# Patient Record
Sex: Male | Born: 1965
Health system: Southern US, Community
[De-identification: ages and names within clinical notes are randomized; demographics above are authoritative.]

## PROBLEM LIST (undated history)

## (undated) HISTORY — PX: ABDOMINAL SURGERY: SHX537

---

## 1988-09-03 HISTORY — PX: ABDOMINAL SURGERY: SHX537

## 2000-05-19 ENCOUNTER — Emergency Department (HOSPITAL_COMMUNITY): Admission: EM | Admit: 2000-05-19 | Discharge: 2000-05-19 | Payer: Self-pay | Admitting: Emergency Medicine

## 2009-02-01 ENCOUNTER — Ambulatory Visit: Payer: Self-pay | Admitting: Internal Medicine

## 2009-02-01 DIAGNOSIS — F17200 Nicotine dependence, unspecified, uncomplicated: Secondary | ICD-10-CM

## 2010-08-06 ENCOUNTER — Emergency Department (HOSPITAL_COMMUNITY)
Admission: EM | Admit: 2010-08-06 | Discharge: 2010-08-06 | Payer: Self-pay | Source: Home / Self Care | Admitting: Emergency Medicine

## 2010-10-01 LAB — CONVERTED CEMR LAB
ALT: 11 units/L (ref 0–53)
AST: 17 units/L (ref 0–37)
Albumin: 4.2 g/dL (ref 3.5–5.2)
Alkaline Phosphatase: 92 units/L (ref 39–117)
Basophils Relative: 0.1 % (ref 0.0–3.0)
Bilirubin Urine: NEGATIVE
Eosinophils Relative: 2.7 % (ref 0.0–5.0)
GFR calc non Af Amer: 77.2 mL/min (ref >60)
Glucose, Bld: 81 mg/dL (ref 70–99)
HCT: 44.2 % (ref 39.0–52.0)
HDL: 50.7 mg/dL (ref >39.00)
Hemoglobin: 15.4 g/dL (ref 13.0–17.0)
Lymphs Abs: 1.9 10*3/uL (ref 0.7–4.0)
Monocytes Relative: 6.1 % (ref 3.0–12.0)
Neutro Abs: 3.9 10*3/uL (ref 1.4–7.7)
PSA: 0.52 ng/mL (ref 0.10–4.00)
Potassium: 4.7 meq/L (ref 3.5–5.1)
RBC: 4.87 M/uL (ref 4.22–5.81)
RDW: 12.8 % (ref 11.5–14.6)
Sodium: 144 meq/L (ref 135–145)
Total CHOL/HDL Ratio: 4
Total Protein, Urine: NEGATIVE mg/dL
Total Protein: 7.4 g/dL (ref 6.0–8.3)
Urine Glucose: NEGATIVE mg/dL
VLDL: 21.8 mg/dL (ref 0.0–40.0)
WBC: 6.4 10*3/uL (ref 4.5–10.5)
pH: 5.5 (ref 5.0–8.0)

## 2016-03-05 ENCOUNTER — Encounter (HOSPITAL_COMMUNITY): Payer: Self-pay

## 2016-03-05 ENCOUNTER — Emergency Department (HOSPITAL_COMMUNITY)
Admission: EM | Admit: 2016-03-05 | Discharge: 2016-03-05 | Disposition: A | Discharge status: Home / Self Care | Payer: Commercial Managed Care - HMO | Attending: Emergency Medicine | Admitting: Emergency Medicine

## 2016-03-05 DIAGNOSIS — M545 Low back pain, unspecified: Secondary | ICD-10-CM

## 2016-03-05 LAB — URINALYSIS, DIPSTICK ONLY
Bilirubin Urine: NEGATIVE
Glucose, UA: NEGATIVE mg/dL
Hgb urine dipstick: NEGATIVE
KETONES UR: NEGATIVE mg/dL
LEUKOCYTES UA: NEGATIVE
NITRITE: NEGATIVE
PROTEIN: NEGATIVE mg/dL
Specific Gravity, Urine: 1.018 (ref 1.005–1.030)
pH: 7 (ref 5.0–8.0)

## 2016-03-05 MED ORDER — CYCLOBENZAPRINE HCL 5 MG PO TABS: 5.0000 mg | ORAL_TABLET | Freq: Three times a day (TID) | ORAL | Status: DC | PRN

## 2016-03-05 MED ORDER — IBUPROFEN 200 MG PO TABS
600.0000 mg | ORAL_TABLET | Freq: Once | ORAL | Status: AC
  Administered 2016-03-05: 600 mg via ORAL
  Filled 2016-03-05: qty 1

## 2016-03-05 NOTE — ED Notes (Signed)
Pt anxiously requesting a sandwich . Pt instructed  He will be provided one at time of DC.

## 2016-03-05 NOTE — ED Notes (Signed)
Patient complains of lower back pain x 4 days, denies trauma. Pain with change in position

## 2016-03-05 NOTE — ED Notes (Signed)
Declined W/C at D/C and was escorted to lobby by RN. 

## 2016-03-05 NOTE — Discharge Instructions (Signed)
Read the information below.   Your urinalysis was normal. Take ibuprofen or Tylenol every 6 hours for pain relief. You have been prescribed a muscle relaxer that you can take as needed for relief. Muscle relaxants can make you drowsy, do not drive or operate heavy machinery after taking. You can apply heat or ice as needed for pain relief. Activity as tolerated. Use the prescribed medication as directed.  Please discuss all new medications with your pharmacist.   It is important he follow up with her primary care provider. Call to schedule an appointment within one week. You may return to the Emergency Department at any time for worsening condition or any new symptoms that concern you. Return to the emergency department if her symptoms worsen, develop a fever, have loss of bowel or bladder function, have numbness or weakness in her lower extremities, or unable to walk.   Back Pain, Adult Back pain is very common. The pain often gets better over time. The cause of back pain is usually not dangerous. Most people can learn to manage their back pain on their own.  HOME CARE  Watch your back pain for any changes. The following actions may help to lessen any pain you are feeling: 1. Stay active. Start with short walks on flat ground if you can. Try to walk farther each day. 2. Exercise regularly as told by your doctor. Exercise helps your back heal faster. It also helps avoid future injury by keeping your muscles strong and flexible. 3. Do not sit, drive, or stand in one place for more than 30 minutes. 4. Do not stay in bed. Resting more than 1-2 days can slow down your recovery. 5. Be careful when you bend or lift an object. Use good form when lifting: 1. Bend at your knees. 2. Keep the object close to your body. 3. Do not twist. 6. Sleep on a firm mattress. Lie on your side, and bend your knees. If you lie on your back, put a pillow under your knees. 7. Take medicines only as told by your  doctor. 8. Put ice on the injured area. 1. Put ice in a plastic bag. 2. Place a towel between your skin and the bag. 3. Leave the ice on for 20 minutes, 2-3 times a day for the first 2-3 days. After that, you can switch between ice and heat packs. 9. Avoid feeling anxious or stressed. Find good ways to deal with stress, such as exercise. 10. Maintain a healthy weight. Extra weight puts stress on your back. GET HELP IF:  1. You have pain that does not go away with rest or medicine. 2. You have worsening pain that goes down into your legs or buttocks. 3. You have pain that does not get better in one week. 4. You have pain at night. 5. You lose weight. 6. You have a fever or chills. GET HELP RIGHT AWAY IF:  1. You cannot control when you poop (bowel movement) or pee (urinate). 2. Your arms or legs feel weak. 3. Your arms or legs lose feeling (numbness). 4. You feel sick to your stomach (nauseous) or throw up (vomit). 5. You have belly (abdominal) pain. 6. You feel like you may pass out (faint).   This information is not intended to replace advice given to you by your health care provider. Make sure you discuss any questions you have with your health care provider.   Document Released: 02/06/2008 Document Revised: 09/10/2014 Document Reviewed: 12/22/2013 Elsevier Interactive Patient  Education 2016 [place] Injury Prevention Back injuries can be very painful. They can also be difficult to heal. After having one back injury, you are more likely to injure your back again. It is important to learn how to avoid injuring or re-injuring your back. The following tips can help you to prevent a back injury. WHAT SHOULD I KNOW ABOUT PHYSICAL FITNESS? 11. Exercise for 30 minutes per day on most days of the week or as told by your doctor. Make sure to: 1. Do aerobic exercises, such as walking, jogging, biking, or swimming. 2. Do exercises that increase balance and strength, such as tai chi  and yoga. 3. Do stretching exercises. This helps with flexibility. 4. Try to develop strong belly (abdominal) muscles. Your belly muscles help to support your back. 12. Stay at a healthy weight. This helps to decrease your risk of a back injury. WHAT SHOULD I KNOW ABOUT MY DIET? 7. Talk with your doctor about your overall diet. Take supplements and vitamins only as told by your doctor. 8. Talk with your doctor about how much calcium and vitamin D you need each day. These nutrients help to prevent weakening of the bones (osteoporosis). 9. Include good sources of calcium in your diet, such as: 1. Dairy products. 2. Green leafy vegetables. 3. Products that have had calcium added to them (fortified). 10. Include good sources of vitamin D in your diet, such as: 1. Milk. 2. Foods that have had vitamin D added to them. WHAT SHOULD I KNOW ABOUT MY POSTURE? 7. Sit up straight and stand up straight. Avoid leaning forward when you sit or hunching over when you stand. 8. Choose chairs that have good low-back (lumbar) support. 9. If you work at a desk, sit close to it so you do not need to lean over. Keep your chin tucked in. Keep your neck drawn back. Keep your elbows bent so your arms look like the letter "L" (right angle). 10. Sit high and close to the steering wheel when you drive. Add a low-back support to your car seat, if needed. 11. Avoid sitting or standing in one position for very long. Take breaks to get up, stretch, and walk around at least one time every hour. Take breaks every hour if you are driving for long periods of time. 12. Sleep on your side with your knees slightly bent, or sleep on your back with a pillow under your knees. Do not lie on the front of your body to sleep. WHAT SHOULD I KNOW ABOUT LIFTING, TWISTING, AND REACHING Lifting and Heavy Lifting 1. Avoid heavy lifting, especially lifting over and over again. If you must do heavy lifting: 1. Stretch before lifting. 2. Work  slowly. 3. Rest between lifts. 4. Use a tool such as a cart or a dolly to move objects if one is available. 5. Make several small trips instead of carrying one heavy load. 6. Ask for help when you need it, especially when moving big objects. 2. Follow these steps when lifting: 1. Stand with your feet shoulder-width apart. 2. Get as close to the object as you can. Do not pick up a heavy object that is far from your body. 3. Use handles or lifting straps if they are available. 4. Bend at your knees. Squat down, but keep your heels off the floor. 5. Keep your shoulders back. Keep your chin tucked in. Keep your back straight. 6. Lift the object slowly while you tighten the muscles in your legs,  belly, and butt. Keep the object as close to the center of your body as possible. 3. Follow these steps when putting down a heavy load: 1. Stand with your feet shoulder-width apart. 2. Lower the object slowly while you tighten the muscles in your legs, belly, and butt. Keep the object as close to the center of your body as possible. 3. Keep your shoulders back. Keep your chin tucked in. Keep your back straight. 4. Bend at your knees. Squat down, but keep your heels off the floor. 5. Use handles or lifting straps if they are available. Twisting and Reaching 1. Avoid lifting heavy objects above your waist. 2. Do not twist at your waist while you are lifting or carrying a load. If you need to turn, move your feet. 3. Do not bend over without bending at your knees. 4. Avoid reaching over your head, across a table, or for an object on a high surface.  WHAT ARE SOME OTHER TIPS? 1. Avoid wet floors and icy ground. Keep sidewalks clear of ice to prevent falls.  2. Do not sleep on a mattress that is too soft or too hard.  3. Keep items that you use often within easy reach.  4. Put heavier objects on shelves at waist level, and put lighter objects on lower or higher shelves. 5. Find ways to lower your  stress, such as: 1. Exercise. 2. Massage. 3. Relaxation techniques. 6. Talk with your doctor if you feel anxious or depressed. These conditions can make back pain worse. 7. Wear flat heel shoes with cushioned soles. 8. Avoid making quick (sudden) movements. 9. Use both shoulder straps when carrying a backpack. 10. Do not use any tobacco products, including cigarettes, chewing tobacco, or electronic cigarettes. If you need help quitting, ask your doctor.   This information is not intended to replace advice given to you by your health care provider. Make sure you discuss any questions you have with your health care provider.   Document Released: 02/06/2008 Document Revised: 01/04/2015 Document Reviewed: 08/24/2014 Elsevier Interactive Patient Education 2016 [place].  Back Exercises If you have pain in your back, do these exercises 2-3 times each day or as told by your doctor. When the pain goes away, do the exercises once each day, but repeat the steps more times for each exercise (do more repetitions). If you do not have pain in your back, do these exercises once each day or as told by your doctor. EXERCISES Single Knee to Chest Do these steps 3-5 times in a row for each leg: 13. Lie on your back on a firm bed or the floor with your legs stretched out. 26. Bring one knee to your chest. 15. Hold your knee to your chest by grabbing your knee or thigh. 16. Pull on your knee until you feel a gentle stretch in your lower back. 17. Keep doing the stretch for 10-30 seconds. 18. Slowly let go of your leg and straighten it. Pelvic Tilt Do these steps 5-10 times in a row: 11. Lie on your back on a firm bed or the floor with your legs stretched out. 12. Bend your knees so they point up to the ceiling. Your feet should be flat on the floor. 13. Tighten your lower belly (abdomen) muscles to press your lower back against the floor. This will make your tailbone point up to the ceiling instead of  pointing down to your feet or the floor. 14. Stay in this position for 5-10 seconds while  you gently tighten your muscles and breathe evenly. Cat-Cow Do these steps until your lower back bends more easily: 13. Get on your hands and knees on a firm surface. Keep your hands under your shoulders, and keep your knees under your hips. You may put padding under your knees. 14. Let your head hang down, and make your tailbone point down to the floor so your lower back is round like the back of a cat. 15. Stay in this position for 5 seconds. 16. Slowly lift your head and make your tailbone point up to the ceiling so your back hangs low (sags) like the back of a cow. 17. Stay in this position for 5 seconds. Press-Ups Do these steps 5-10 times in a row: 4. Lie on your belly (face-down) on the floor. 5. Place your hands near your head, about shoulder-width apart. 6. While you keep your back relaxed and keep your hips on the floor, slowly straighten your arms to raise the top half of your body and lift your shoulders. Do not use your back muscles. To make yourself more comfortable, you may change where you place your hands. 7. Stay in this position for 5 seconds. 8. Slowly return to lying flat on the floor. Bridges Do these steps 10 times in a row: 5. Lie on your back on a firm surface. 6. Bend your knees so they point up to the ceiling. Your feet should be flat on the floor. 7. Tighten your butt muscles and lift your butt off of the floor until your waist is almost as high as your knees. If you do not feel the muscles working in your butt and the back of your thighs, slide your feet 1-2 inches farther away from your butt. 8. Stay in this position for 3-5 seconds. 9. Slowly lower your butt to the floor, and let your butt muscles relax. If this exercise is too easy, try doing it with your arms crossed over your chest. Belly Crunches Do these steps 5-10 times in a row: 11. Lie on your back on a firm bed  or the floor with your legs stretched out. 12. Bend your knees so they point up to the ceiling. Your feet should be flat on the floor. 68. Cross your arms over your chest. 14. Tip your chin a little bit toward your chest but do not bend your neck. 79. Tighten your belly muscles and slowly raise your chest just enough to lift your shoulder blades a tiny bit off of the floor. 16. Slowly lower your chest and your head to the floor. Back Lifts Do these steps 5-10 times in a row: 1. Lie on your belly (face-down) with your arms at your sides, and rest your forehead on the floor. 2. Tighten the muscles in your legs and your butt. 3. Slowly lift your chest off of the floor while you keep your hips on the floor. Keep the back of your head in line with the curve in your back. Look at the floor while you do this. 4. Stay in this position for 3-5 seconds. 5. Slowly lower your chest and your face to the floor. GET HELP IF:  Your back pain gets a lot worse when you do an exercise.  Your back pain does not lessen 2 hours after you exercise. If you have any of these problems, stop doing the exercises. Do not do them again unless your doctor says it is okay. GET HELP RIGHT AWAY IF:  You have sudden,  very bad back pain. If this happens, stop doing the exercises. Do not do them again unless your doctor says it is okay.   This information is not intended to replace advice given to you by your health care provider. Make sure you discuss any questions you have with your health care provider.   Document Released: 09/22/2010 Document Revised: 05/11/2015 Document Reviewed: 10/14/2014 Elsevier Interactive Patient Education [employer].

## 2016-03-05 NOTE — ED Provider Notes (Signed)
CSN: [id number]     Arrival date & time 03/05/16  1015 History  By signing my name below, I, Emmanuella Mensah, attest that this documentation has been prepared under the direction and in the presence of Arvilla MeresAshley Meyer, PA-C. Electronically Signed: Angelene GiovanniEmmanuella Mensah, ED Scribe. 03/05/2016. 12:30 PM.    Chief Complaint  Patient presents with  . Back Pain   The history is provided by the patient. No language interpreter was used.   HPI Comments: Austin Walsh is a 50 y.o. male who presents to the Emergency Department complaining of gradually worsening intermittent non-radiating sharp shooting right lower back pain onset 4 days ago. He reports that the pain is worse with movement, especially bending but better when he stays still. No alleviating factors noted. Pt has not tried any medications for these symptoms PTA. 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 He denies any recent falls, injuries, or trauma. He denies any hx of cancer or IV drug use. He also denies any fever, unexpected weight loss, dysuria, hematuria, scrotal pain/swelling, penile discharge, abdominal pain, n/v/d, rash, numbness/weakness to BLE, bowel/bladder incontinence, or saddle anesthesia. Pt received 600 mg Ibuprofen here in the ED and states that it provided mild relief.    History reviewed. No pertinent past medical history. History reviewed. No pertinent past surgical history. No family history on file. Social History  Substance Use Topics  . Smoking status: Never Smoker   . Smokeless tobacco: None  . Alcohol Use: None    Review of Systems  Constitutional: Negative for fever, chills, diaphoresis and unexpected weight change.  Gastrointestinal: Negative for nausea, vomiting, abdominal pain and diarrhea.  Genitourinary: Negative for dysuria, hematuria, penile swelling, scrotal swelling and testicular pain.  Musculoskeletal: Positive for back pain.  Skin: Negative for rash.   Neurological: Negative for weakness and numbness.      Allergies  Review of patient's allergies indicates no known allergies.  Home Medications   Prior to Admission medications   Medication Sig Start Date End Date Taking? Authorizing Provider  cyclobenzaprine (FLEXERIL) 5 MG tablet Take 1 tablet (5 mg total) by mouth 3 (three) times daily as needed for muscle spasms. 03/05/16   Lona KettleAshley Laurel Meyer, PA-C   BP 117/80 mmHg  Pulse 86  Temp(Src) 98 F (36.7 C) (Oral)  Resp 17  Ht 6\' 1"  (1.854 m)  Wt 92.987 kg  BMI 27.05 kg/m2  SpO2 97% Physical Exam  Constitutional: He appears well-developed and well-nourished. No distress.  HENT:  Head: Normocephalic and atraumatic.  Eyes: Conjunctivae are normal. No scleral icterus.  Neck: Normal range of motion.  Cardiovascular: Normal rate and intact distal pulses.   Pulmonary/Chest: Effort normal. No respiratory distress.  Abdominal: Soft. Bowel sounds are normal. There is no tenderness.  Musculoskeletal:  No tenderness to the spine, no step-off Tenderness to lumbar paravertebral muscles, no CVA tenderness.  Full ROM of back Negative straight leg raise  Neurological: He is alert. Coordination normal.  Mental Status:  Alert, thought content appropriate, able to give a coherent history. Speech fluent without evidence of aphasia. Able to follow 2 step commands without difficulty.  Cranial Nerves:  II:  Peripheral visual fields grossly normal, pupils equal, round, reactive to light III,IV, VI: ptosis not present, extra-ocular motions intact bilaterally  V,VII: smile symmetric, facial light touch sensation equal VIII: hearing grossly normal to voice  X: uvula elevates symmetrically  XI: bilateral shoulder shrug symmetric and strong XII: midline tongue extension  without fassiculations Motor:  Normal tone. 5/5 in upper and lower extremities bilaterally including strong and equal grip strength and dorsiflexion/plantar flexion Sensory: light  touch normal in all extremities.  Deep Tendon Reflexes: 2+ and symmetric in the biceps and patella Cerebellar: normal finger-to-nose with bilateral upper extremities Gait: normal gait and balance CV: distal pulses palpable throughout   Skin: Skin is warm and dry. He is not diaphoretic.  Psychiatric: He has a normal mood and affect.  Nursing note and vitals reviewed.   ED Course  Procedures (including critical care time) DIAGNOSTIC STUDIES: Oxygen Saturation is 97% on RA, normal by my interpretation.    COORDINATION OF CARE: 12:21 PM- Pt advised of plan for treatment and pt agrees. Pt will provide urine sample for UA for further evaluation. He will receive muscle relaxants. Advised to use Tylenol and Ibuprofen for pain relief. Return precautions discussed with numbness/weakness, fever, or bowel/bladder incontinence. Re-assured pt he can return to work in 2 days.   MDM   Final diagnoses:  Right-sided low back pain without sciatica    Pt is afebrile and nontoxic appearing in NAD. Vital signs are stable. Patient with back pain.  No neurological deficits and normal neuro exam.  Patient can walk but states is painful.  No loss of bowel or bladder control.  Low suspicion for cauda equina.  No fever, night sweats, weight loss, h/o cancer, IVDU - low suspicion for epidural abscess. U/A negative for UTI. Suspect MSK in nature. RICE protocol and pain medicine indicated and discussed with patient. Encouraged follow up with PCP. Return precautions discussed. Pt voiced understanding and is agreeable.    I personally performed the services described in this documentation, which was scribed in my presence. The recorded information has been reviewed and is accurate.   Herminio Commonsshley Laurel HammondMeyer, [place] JerseyPA-C 03/06/16 [id number]  Mancel BaleElliott Wentz, MD 03/08/16 [phone]

## 2017-01-30 ENCOUNTER — Ambulatory Visit (HOSPITAL_COMMUNITY)
Admission: EM | Admit: 2017-01-30 | Discharge: 2017-01-30 | Disposition: A | Discharge status: Home / Self Care | Payer: Self-pay | Attending: Emergency Medicine | Admitting: Emergency Medicine

## 2017-01-30 ENCOUNTER — Encounter (HOSPITAL_COMMUNITY): Payer: Self-pay | Admitting: Family Medicine

## 2017-01-30 ENCOUNTER — Ambulatory Visit (INDEPENDENT_AMBULATORY_CARE_PROVIDER_SITE_OTHER): Payer: Self-pay

## 2017-01-30 DIAGNOSIS — R071 Chest pain on breathing: Secondary | ICD-10-CM

## 2017-01-30 DIAGNOSIS — S29019A Strain of muscle and tendon of unspecified wall of thorax, initial encounter: Secondary | ICD-10-CM | POA: Diagnosis present

## 2017-01-30 DIAGNOSIS — M62830 Muscle spasm of back: Secondary | ICD-10-CM | POA: Diagnosis present

## 2017-01-30 DIAGNOSIS — Z720 Tobacco use: Secondary | ICD-10-CM

## 2017-01-30 DIAGNOSIS — S29012A Strain of muscle and tendon of back wall of thorax, initial encounter: Secondary | ICD-10-CM | POA: Insufficient documentation

## 2017-01-30 DIAGNOSIS — F17210 Nicotine dependence, cigarettes, uncomplicated: Secondary | ICD-10-CM

## 2017-01-30 DIAGNOSIS — X58XXXA Exposure to other specified factors, initial encounter: Secondary | ICD-10-CM | POA: Insufficient documentation

## 2017-01-30 DIAGNOSIS — F17200 Nicotine dependence, unspecified, uncomplicated: Secondary | ICD-10-CM | POA: Insufficient documentation

## 2017-01-30 MED ORDER — DIAZEPAM 5 MG/ML IJ SOLN
5.0000 mg | Freq: Once | INTRAMUSCULAR | Status: AC
  Administered 2017-01-30: 5 mg via INTRAMUSCULAR

## 2017-01-30 MED ORDER — DIAZEPAM 5 MG/ML IJ SOLN
INTRAMUSCULAR | Status: AC
  Filled 2017-01-30: qty 2

## 2017-01-30 MED ORDER — ORPHENADRINE CITRATE ER 100 MG PO TB12: 100.0000 mg | ORAL_TABLET | Freq: Two times a day (BID) | ORAL | 0 refills | Status: DC

## 2017-01-30 MED ORDER — KETOROLAC TROMETHAMINE 30 MG/ML IJ SOLN
INTRAMUSCULAR | Status: AC
  Filled 2017-01-30: qty 1

## 2017-01-30 MED ORDER — LIDOCAINE 5 % EX PTCH: 1.0000 | MEDICATED_PATCH | CUTANEOUS | 0 refills | Status: DC

## 2017-01-30 MED ORDER — KETOROLAC TROMETHAMINE 60 MG/2ML IM SOLN
30.0000 mg | Freq: Once | INTRAMUSCULAR | Status: AC
  Administered 2017-01-30: 30 mg via INTRAMUSCULAR

## 2017-01-30 NOTE — ED Provider Notes (Signed)
CSN: [id number]     Arrival date & time 01/30/17  1237 History   None    Chief Complaint  Patient presents with  . Back Pain   (Consider location/radiation/quality/duration/timing/severity/associated sxs/prior Treatment) The history is provided by the patient and a relative. No language interpreter was used.  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 No pertinent past medical history. History reviewed. No pertinent surgical history. History reviewed. No pertinent family history. Social History  Substance Use Topics  . Smoking status: Current Every Day Smoker  . Smokeless tobacco: Never Used  . Alcohol use Not on file    Review of Systems  Constitutional: Positive for activity change. Negative for fever.  HENT: Negative.   Eyes: Negative.   Respiratory: Negative for cough, chest tightness, shortness of breath and wheezing.        Right sided chest wall pain with palpation,deep breath  Cardiovascular: Negative for chest pain and palpitations.  Gastrointestinal: Negative.   Endocrine: Negative.   Genitourinary: Negative.   Musculoskeletal: Positive for back pain and myalgias.  Skin: Negative.   Allergic/Immunologic: Negative.   Neurological: Negative.   Hematological: Negative.   Psychiatric/Behavioral: Negative.   All other systems reviewed and are negative.   Allergies  Patient has no known allergies.  Home Medications   Prior to Admission medications   Medication Sig Start  Date End Date Taking? Authorizing Provider  cyclobenzaprine (FLEXERIL) 5 MG tablet Take 1 tablet (5 mg total) by mouth 3 (three) times daily as needed for muscle spasms. 03/05/16   Arvilla Meres L, PA-C  lidocaine (LIDODERM) 5 % Place 1 patch onto the skin daily. Remove & Discard patch within 12 hours or as directed by MD 01/30/17   Cherilyn Sautter, Para March, NP  orphenadrine (NORFLEX) 100 MG tablet Take 1 tablet (100 mg total) by mouth 2 (two) times daily. 01/30/17   Jalea Bronaugh, Para March, NP   Meds Ordered and Administered this Visit   Medications  ketorolac (TORADOL) injection 30 mg (30 mg Intramuscular Given 01/30/17 1341)  diazepam (VALIUM) injection 5 mg (5 mg Intramuscular Given 01/30/17 1343)    BP (!) 135/99   Pulse (!) 104   Temp 98.1 F (36.7 C)   Resp 18   SpO2 95%  No data found.   Physical Exam  Constitutional: He is oriented to person, place, and time. He appears well-developed and well-nourished. He is active and cooperative. He appears distressed.  HENT:  Head: Normocephalic.  Eyes: Pupils are equal, round, and reactive to light.  Neck: Normal range of motion.  Cardiovascular: Normal rate, regular rhythm and normal pulses.   Pulmonary/Chest: Effort normal and breath sounds normal.     He exhibits tenderness.  Musculoskeletal: Normal range of motion.  Neurological: He is alert and oriented to person, place, and time. GCS eye subscore is 4. GCS verbal subscore is 5. GCS motor subscore is 6.  Skin: Skin is warm and dry. No rash noted.  Psychiatric: His behavior is normal. His mood appears  anxious.  Nursing note and vitals reviewed.   Urgent Care Course     Procedures (including critical care time)  Labs Review Labs Reviewed - No data to display  Imaging Review Dg Ribs Unilateral W/chest Right  Result Date: 01/30/2017 CLINICAL DATA:  Chest pain and shortness of Breath EXAM: RIGHT RIBS AND CHEST - 3+ VIEW COMPARISON:  None. FINDINGS: Cardiac shadow is within normal  limits. Bibasilar opacities are noted likely related atelectasis. No acute displaced or deforming rib fractures are seen. No pneumothorax is noted. IMPRESSION: Bibasilar changes as described.  No acute rib fracture is noted. Electronically Signed   By: Alcide CleverMark  Lukens M.D.   On: 01/30/2017 13:40          MDM   1. Muscle spasm of back   2. Thoracic myofascial strain, initial encounter   3. Cigarette smoker     Valium and Toradol given in office, pt reassessed improved w meds. Rest, push fluids. Use Incentive Spirometer as directed. Go immediately to Er if you develop shortness of breath, chest pain,palpitations, or worsening pain. Avoid lifting, turning,twisting. Pt and family verbalized understanding to this provider.    Clancy Gourdefelice, Ruthe Roemer, NP 01/30/17 [phone]

## 2017-01-30 NOTE — ED Triage Notes (Signed)
Pt here for right sided back pain. Pt points to where ribs area and into lower back. sts started last night. Denies injury or heavy lifting. sts hurts to move and breathe and breathing is different. Denies cough and fever.

## 2017-01-30 NOTE — Discharge Instructions (Signed)
Rest, push fluids. Use Incentive Spirometer as directed. Go immediately to Er if you develop shortness of breath, chest pain,palpitations, or worsening pain. Avoid lifting, turning,twisting.

## 2017-01-30 NOTE — ED Notes (Signed)
Patient in xray at attempt to administer pain medication.

## 2017-01-31 ENCOUNTER — Encounter (HOSPITAL_COMMUNITY): Payer: Self-pay | Admitting: Emergency Medicine

## 2017-01-31 ENCOUNTER — Emergency Department (HOSPITAL_COMMUNITY)
Admission: EM | Admit: 2017-01-31 | Discharge: 2017-01-31 | Disposition: A | Discharge status: Home / Self Care | Payer: Commercial Managed Care - HMO | Attending: Physician Assistant | Admitting: Physician Assistant

## 2017-01-31 DIAGNOSIS — F17200 Nicotine dependence, unspecified, uncomplicated: Secondary | ICD-10-CM | POA: Insufficient documentation

## 2017-01-31 DIAGNOSIS — Z720 Tobacco use: Secondary | ICD-10-CM

## 2017-01-31 DIAGNOSIS — M62830 Muscle spasm of back: Secondary | ICD-10-CM | POA: Insufficient documentation

## 2017-01-31 LAB — CBC WITH DIFFERENTIAL/PLATELET
BASOS ABS: 0 10*3/uL (ref 0.0–0.1)
Basophils Relative: 0 %
EOS PCT: 3 %
Eosinophils Absolute: 0.3 10*3/uL (ref 0.0–0.7)
HCT: 41.9 % (ref 39.0–52.0)
Hemoglobin: 14.8 g/dL (ref 13.0–17.0)
LYMPHS ABS: 2.4 10*3/uL (ref 0.7–4.0)
LYMPHS PCT: 24 %
MCH: 31.9 pg (ref 26.0–34.0)
MCHC: 35.3 g/dL (ref 30.0–36.0)
MCV: 90.3 fL (ref 78.0–100.0)
MONO ABS: 1.3 10*3/uL — AB (ref 0.1–1.0)
Monocytes Relative: 13 %
Neutro Abs: 6 10*3/uL (ref 1.7–7.7)
Neutrophils Relative %: 60 %
PLATELETS: 185 10*3/uL (ref 150–400)
RBC: 4.64 MIL/uL (ref 4.22–5.81)
RDW: 13.5 % (ref 11.5–15.5)
WBC: 9.9 10*3/uL (ref 4.0–10.5)

## 2017-01-31 LAB — BASIC METABOLIC PANEL
ANION GAP: 8 (ref 5–15)
BUN: 12 mg/dL (ref 6–20)
CALCIUM: 8.3 mg/dL — AB (ref 8.9–10.3)
CO2: 27 mmol/L (ref 22–32)
Chloride: 103 mmol/L (ref 101–111)
Creatinine, Ser: 1.16 mg/dL (ref 0.61–1.24)
GFR calc Af Amer: >60 mL/min (ref >60)
GLUCOSE: 109 mg/dL — AB (ref 65–99)
POTASSIUM: 3.8 mmol/L (ref 3.5–5.1)
Sodium: 138 mmol/L (ref 135–145)

## 2017-01-31 LAB — URINALYSIS, ROUTINE W REFLEX MICROSCOPIC
Bilirubin Urine: NEGATIVE
GLUCOSE, UA: NEGATIVE mg/dL
HGB URINE DIPSTICK: NEGATIVE
Ketones, ur: NEGATIVE mg/dL
LEUKOCYTES UA: NEGATIVE
Nitrite: NEGATIVE
PH: 5 (ref 5.0–8.0)
PROTEIN: NEGATIVE mg/dL
Specific Gravity, Urine: 1.031 — ABNORMAL HIGH (ref 1.005–1.030)

## 2017-01-31 MED ORDER — PREDNISONE 50 MG PO TABS: 50.0000 mg | ORAL_TABLET | Freq: Every day | ORAL | 0 refills | Status: DC

## 2017-01-31 MED ORDER — TRAMADOL HCL 50 MG PO TABS: 50.0000 mg | ORAL_TABLET | Freq: Four times a day (QID) | ORAL | 0 refills | Status: DC | PRN

## 2017-01-31 MED ORDER — KETOROLAC TROMETHAMINE 15 MG/ML IJ SOLN: 15.0000 mg | Freq: Once | INTRAMUSCULAR | Status: DC

## 2017-01-31 MED ORDER — OXYCODONE-ACETAMINOPHEN 5-325 MG PO TABS
ORAL_TABLET | ORAL | Status: AC
  Filled 2017-01-31: qty 1

## 2017-01-31 MED ORDER — OXYCODONE-ACETAMINOPHEN 5-325 MG PO TABS
1.0000 | ORAL_TABLET | Freq: Once | ORAL | Status: AC
  Administered 2017-01-31: 1 via ORAL
  Filled 2017-01-31: qty 1

## 2017-01-31 MED ORDER — OXYCODONE-ACETAMINOPHEN 5-325 MG PO TABS
1.0000 | ORAL_TABLET | Freq: Once | ORAL | Status: AC
  Administered 2017-01-31: 1 via ORAL

## 2017-01-31 NOTE — ED Triage Notes (Signed)
Patient reports right lower back pain onset last night worse with movement /changing positions , denies dysuria or hematuria , seen at an urgent care yesterday prescribed with medication with non relief.

## 2017-01-31 NOTE — Discharge Instructions (Signed)
Take medications as written. If he develop inability to fall, fever, loss of bowel or bladder function returned to the emergency department. He can return at any point. Please follow up with her primary care provider in the next 2 days. I provided information on [facility] therapy in addition to the medications. Please take a muscle relaxer that you were prescribed at the urgent care at night, as written.

## 2017-01-31 NOTE — ED Provider Notes (Signed)
[facility]-EMERGENCY DEPT Provider Note   CSN: [id number] Arrival date & time: 01/31/17  0340     History   Chief Complaint Chief Complaint  Patient presents with  . Back Pain    HPI Austin Walsh is a 51 y.o. male who presents today with right mid and lower back pain 1 day. The patient was seen. The patient was seen yesterday at urgent care for same complaint. He received a x-ray of ribs with chest which did not show any acute rib fracture. Did show bibasilar changes. He was given Valium and Toradol office which improved his symptoms. Was given muscle relaxer at discharge. He presents today with ongoing, 10/10, constant, sharp, mid to lower right sided back pain that radiates into his right side. The patient notes that this is worsened with movement. He has not taken anything for this today. He does not feel that muscle relaxer helped improve his pain yesterday. 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 The patient denies trauma, history of cancer, fever, injection drug use, numbness, tingling, walking, weakness, saddle anesthesia, urinary retention, unintentional weight loss. He still didn't control his bowel and bladder functions.   HPI  History reviewed. No pertinent past medical history.  Patient Active Problem List   Diagnosis Date Noted  . Muscle spasm of back 01/30/2017  . Thoracic myofascial strain 01/30/2017  . Cigarette smoker 01/30/2017  . TOBACCO USE DISORDER/SMOKER-SMOKING CESSATION DISCUSSED 02/01/2009    Past Surgical History:  Procedure Laterality Date  . ABDOMINAL SURGERY         Home Medications    Prior to Admission medications   Medication Sig Start Date End Date Taking? Authorizing Provider  cyclobenzaprine (FLEXERIL) 5 MG tablet Take 1 tablet (5 mg total) by mouth 3 (three) times daily as needed for muscle spasms. 03/05/16   Arvilla MeresMeyer, Ashley L, PA-C  lidocaine (LIDODERM) 5 % Place 1 patch onto  the skin daily. Remove & Discard patch within 12 hours or as directed by MD 01/30/17   Defelice, Para MarchJeanette, NP  orphenadrine (NORFLEX) 100 MG tablet Take 1 tablet (100 mg total) by mouth 2 (two) times daily. 01/30/17   Defelice, Para MarchJeanette, NP  predniSONE (DELTASONE) 50 MG tablet Take 1 tablet (50 mg total) by mouth daily with breakfast. 01/31/17   Argie Applegate, Elmer SowMichael M, PA-C  traMADol (ULTRAM) 50 MG tablet Take 1 tablet (50 mg total) by mouth every 6 (six) hours as needed. 01/31/17   Nesreen Albano, Elmer SowMichael M, PA-C    Family History No family history on file.  Social History Social History  Substance Use Topics  . Smoking status: Current Every Day Smoker  . Smokeless tobacco: Never Used  . Alcohol use No     Allergies   Patient has no known allergies.   Review of Systems Review of Systems  All other systems reviewed and are negative.    Physical Exam Updated Vital Signs BP 129/71   Pulse (!) 110   Temp 98.5 F (36.9 C) (Oral)   Resp (!) 22   Ht 6\' 2"  (1.88 m)   Wt 91.6 kg (202 lb)   SpO2 98%   BMI 25.94 kg/m   Physical Exam  Constitutional: He appears well-developed and well-nourished.  HENT:  Head: Normocephalic and atraumatic.  Eyes: Conjunctivae are normal. Right eye exhibits no discharge. Left eye exhibits no discharge. No scleral icterus.  Cardiovascular: Normal rate, regular rhythm and normal heart  sounds.   Pulmonary/Chest: Effort normal. No respiratory distress.  Musculoskeletal:       Right hip: Normal.       Left hip: Normal.       Right knee: Normal.       Left knee: Normal.       Cervical back: Normal.       Thoracic back: He exhibits tenderness (right side), pain and spasm.       Lumbar back: He exhibits decreased range of motion (due to pain), tenderness (right), pain and spasm. He exhibits no bony tenderness and no swelling.  Neurological: He is alert. He has normal strength and normal reflexes. No sensory deficit. Gait (able) normal.  Reflex Scores:       Patellar reflexes are 2+ on the right side and 2+ on the left side.      Achilles reflexes are 2+ on the right side and 2+ on the left side. Skin: Skin is warm and dry. No pallor.  Psychiatric: He has a normal mood and affect.  Nursing note and vitals reviewed.    ED Treatments / Results  Labs (all labs ordered are listed, but only abnormal results are displayed) Labs Reviewed  CBC WITH DIFFERENTIAL/PLATELET - Abnormal; Notable for the following:       Result Value   Monocytes Absolute 1.3 (*)    All other components within normal limits  BASIC METABOLIC PANEL - Abnormal; Notable for the following:    Glucose, Bld 109 (*)    Calcium 8.3 (*)    All other components within normal limits  URINALYSIS, ROUTINE W REFLEX MICROSCOPIC - Abnormal; Notable for the following:    Specific Gravity, Urine 1.031 (*)    All other components within normal limits    EKG  EKG Interpretation None       Radiology Dg Ribs Unilateral W/chest Right  Result Date: 01/30/2017 CLINICAL DATA:  Chest pain and shortness of Breath EXAM: RIGHT RIBS AND CHEST - 3+ VIEW COMPARISON:  None. FINDINGS: Cardiac shadow is within normal limits. Bibasilar opacities are noted likely related atelectasis. No acute displaced or deforming rib fractures are seen. No pneumothorax is noted. IMPRESSION: Bibasilar changes as described.  No acute rib fracture is noted. Electronically Signed   By: Alcide Clever M.D.   On: 01/30/2017 13:40    Procedures Procedures (including critical care time)  Medications Ordered in ED Medications  oxyCODONE-acetaminophen (PERCOCET/ROXICET) 5-325 MG per tablet 1 tablet (1 tablet Oral Given 01/31/17 0400)  oxyCODONE-acetaminophen (PERCOCET/ROXICET) 5-325 MG per tablet 1 tablet (1 tablet Oral Given 01/31/17 0951)     Initial Impression / Assessment and Plan / ED Course  I have reviewed the triage vital signs and the nursing notes.  Pertinent labs & imaging results that were available during  my care of the patient were reviewed by me and considered in my medical decision making (see chart for details).     Austin Walsh is a 51 y.o. male who presents today with right mid and lower back pain 1 day. The patient denies trauma, history of cancer, fever, injection drug use, numbness, tingling, walking, weakness, saddle anesthesia, urinary retention, unintentional weight loss. He still didn't control his bowel and bladder functions. Patient is tender to touch along lumbar and thoracic on right side. Cardiopulmonary exam benign. Patients vitals wnl at time of exam. Triage note reviewed. Urgent care note from yesterday reviewed. Xrays from yesterday visit reviewed. Labs unremarkable.   The patient was given percocet for  pain relief in house. Sent home with Ultram, Prednisone. Told to take muscle relaxer's at night. Follow up with PCP in the next 2 days to evaluate for further treatment. Return precautions given. Patient told he can return at anytime. Documented vital signs elevated at time of discharge as patient is up pacing in room.   Patient does not have any history of medications on the [place] substance abuse registry.    Final Clinical Impressions(s) / ED Diagnoses   Final diagnoses:  Muscle spasm of back  Tobacco abuse    New Prescriptions New Prescriptions   PREDNISONE (DELTASONE) 50 MG TABLET    Take 1 tablet (50 mg total) by mouth daily with breakfast.   TRAMADOL (ULTRAM) 50 MG TABLET    Take 1 tablet (50 mg total) by mouth every 6 (six) hours as needed.     Jacinto Halim, PA-C 01/31/17 1003    Jacinto Halim, PA-C 01/31/17 1028    Mackuen, Cindee Salt, MD 01/31/17 [phone]

## 2020-07-19 ENCOUNTER — Encounter: Payer: Self-pay | Admitting: Critical Care Medicine

## 2020-07-19 ENCOUNTER — Ambulatory Visit: Payer: Self-pay | Attending: Critical Care Medicine | Admitting: Critical Care Medicine

## 2020-07-19 ENCOUNTER — Other Ambulatory Visit: Payer: Self-pay | Admitting: Critical Care Medicine

## 2020-07-19 ENCOUNTER — Other Ambulatory Visit: Payer: Self-pay

## 2020-07-19 VITALS — BP 104/70 | HR 83 | Ht 73.74 in | Wt 240.8 lb

## 2020-07-19 DIAGNOSIS — Z0000 Encounter for general adult medical examination without abnormal findings: Secondary | ICD-10-CM

## 2020-07-19 DIAGNOSIS — Z1211 Encounter for screening for malignant neoplasm of colon: Secondary | ICD-10-CM

## 2020-07-19 DIAGNOSIS — F17200 Nicotine dependence, unspecified, uncomplicated: Secondary | ICD-10-CM

## 2020-07-19 DIAGNOSIS — Z1159 Encounter for screening for other viral diseases: Secondary | ICD-10-CM

## 2020-07-19 DIAGNOSIS — N529 Male erectile dysfunction, unspecified: Secondary | ICD-10-CM

## 2020-07-19 DIAGNOSIS — Z114 Encounter for screening for human immunodeficiency virus [HIV]: Secondary | ICD-10-CM

## 2020-07-19 DIAGNOSIS — Z125 Encounter for screening for malignant neoplasm of prostate: Secondary | ICD-10-CM

## 2020-07-19 MED ORDER — NICOTINE POLACRILEX 4 MG MT LOZG: 4.0000 mg | LOZENGE | OROMUCOSAL | 0 refills | Status: DC | PRN

## 2020-07-19 MED ORDER — SILDENAFIL CITRATE 100 MG PO TABS: 50.0000 mg | ORAL_TABLET | Freq: Every day | ORAL | 11 refills | Status: DC | PRN

## 2020-07-19 MED FILL — SILDENAFIL CITRATE 100 MG T: 100 | 30 days supply | Qty: 5 | Fill #0
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
## 2020-07-19 NOTE — Assessment & Plan Note (Signed)
Erectile dysfunction unclear etiology doubt organic  Will do prostate monitor screen and PSA  Also check testosterone level  Begin Viagra as needed  May yet need urology referral

## 2020-07-19 NOTE — Progress Notes (Signed)
Establish care -no other issues

## 2020-07-19 NOTE — Assessment & Plan Note (Signed)
° ° °•   Current smoking consumption amount: 1/2 pack a day  . Dicsussion on advise to quit smoking and smoking impacts: Cardiovascular and lung health  . Patient's willingness to quit: Interested in quitting  . Methods to quit smoking discussed: Nicotine replacement and behavioral modification  . Medication management of smoking session drugs discussed: Nicotine lozenges 4 mg to take 3 times daily as needed  . Resources provided:  AVS   . Setting quit date not yet established  . Follow-up arranged 2 months   Time spent counseling the patient: 5 minutes   

## 2020-07-19 NOTE — Assessment & Plan Note (Addendum)
Wellness exam complete he has mild periodontal disease and is brushing his teeth daily  Patient has erectile dysfunction this will be addressed see below  Patient is an active smoker this will be addressed see below  Full and complete screening labs obtained including HIV hepatitis C metabolic panel profile complete blood count PSA testosterone levels  Colon cancer screen with Fecal occult  Patient also will receive a Tdap  Patient to obtain Covid booster shot

## 2020-07-19 NOTE — Patient Instructions (Addendum)
Use nicotine lozenges 2-3 times daily to reduce and and tobacco use, see instructions below  Use Viagra as needed for erectile dysfunction  A tetanus vaccine was given  Labs today include hepatitis C HIV metabolic panel complete blood count prostate cancer screening,  a stool kit for fecal occult to rule out colon cancer was issued please process and return, and testosterone levels will be checked  Focus on a healthy diet as you are body mass index is elevated at 31, see below for food choices  Increase your activity levels when not working by walking 30 minutes a day for 3 to 4 days weekly  Please obtain a Covid vaccine booster see information below where you can find a booster can be the [employer] or [employer] or [employer] Information can be found at: ShippingScam.co.uk For questions related to vaccine distribution or appointments, please email [email or web address] or call [phone].      Obesity, Adult Obesity is having too much body fat. Being obese means that your weight is more than what is healthy for you. BMI is a number that explains how much body fat you have. If you have a BMI of 30 or more, you are obese. Obesity is often caused by eating or drinking more calories than your body uses. Changing your lifestyle can help you lose weight. Obesity can cause serious health problems, such as:  Stroke.  Coronary artery disease (CAD).  Type 2 diabetes.  Some types of cancer, including cancers of the colon, breast, uterus, and gallbladder.  Osteoarthritis.  High blood pressure (hypertension).  High cholesterol.  Sleep apnea.  Gallbladder stones.  Infertility problems. What are the causes?  Eating meals each day that are high in calories, sugar, and fat.  Being born with genes that may make you more likely to become obese.  Having a medical condition that causes obesity.  Taking  certain medicines.  Sitting a lot (having a sedentary lifestyle).  Not getting enough sleep.  Drinking a lot of drinks that have sugar in them. What increases the risk?  Having a family history of obesity.  Being an [place] American woman.  Being a Hispanic man.  Living in an area with limited access to: ? Romilda Garret, recreation centers, or sidewalks. ? Healthy food choices, such as grocery stores and farmers' markets. What are the signs or symptoms? The main sign is having too much body fat. How is this treated?  Treatment for this condition often includes changing your lifestyle. Treatment may include: ? Changing your diet. This may include making a healthy meal plan. ? Exercise. This may include activity that causes your heart to beat faster (aerobic exercise) and strength training. Work with your doctor to design a program that works for you. ? Medicine to help you lose weight. This may be used if you are not able to lose 1 pound a week after 6 weeks of healthy eating and more exercise. ? Treating conditions that cause the obesity. ? Surgery. Options may include gastric banding and gastric bypass. This may be done if:  Other treatments have not helped to improve your condition.  You have a BMI of 40 or higher.  You have life-threatening health problems related to obesity. Follow these instructions at home: Eating and drinking   Follow advice from your doctor about what to eat and drink. Your doctor may tell you to: ? Limit fast food, sweets, and processed snack foods. ? Choose low-fat options. For example, choose low-fat milk  instead of whole milk. ? Eat 5 or more servings of fruits or vegetables each day. ? Eat at home more often. This gives you more control over what you eat. ? Choose healthy foods when you eat out. ? Learn to read food labels. This will help you learn how much food is in 1 serving. ? Keep low-fat snacks available. ? Avoid drinks that have a lot of  sugar in them. These include soda, fruit juice, iced tea with sugar, and flavored milk.  Drink enough water to keep your pee (urine) pale yellow.  Do not go on fad diets. Physical activity  Exercise often, as told by your doctor. Most adults should get up to 150 minutes of moderate-intensity exercise every week.Ask your doctor: ? What types of exercise are safe for you. ? How often you should exercise.  Warm up and stretch before being active.  Do slow stretching after being active (cool down).  Rest between times of being active. Lifestyle  Work with your doctor and a food expert (dietitian) to set a weight-loss goal that is best for you.  Limit your screen time.  Find ways to reward yourself that do not involve food.  Do not drink alcohol if: ? Your doctor tells you not to drink. ? You are pregnant, may be pregnant, or are planning to become pregnant.  If you drink alcohol: ? Limit how much you use to:  0-1 drink a day for women.  0-2 drinks a day for men. ? Be aware of how much alcohol is in your drink. In the [place] one drink equals one 12 oz bottle of beer (355 mL), one 5 oz glass of wine (148 mL), or one 1 oz glass of hard liquor (44 mL). General instructions  Keep a weight-loss journal. This can help you keep track of: ? The food that you eat. ? How much exercise you get.  Take over-the-counter and prescription medicines only as told by your doctor.  Take vitamins and supplements only as told by your doctor.  Think about joining a support group.  Keep all follow-up visits as told by your doctor. This is important. Contact a doctor if:  You cannot meet your weight loss goal after you have changed your diet and lifestyle for 6 weeks. Get help right away if you:  Are having trouble breathing.  Are having thoughts of harming yourself. Summary  Obesity is having too much body fat.  Being obese means that your weight is more than what is healthy for  you.  Work with your doctor to set a weight-loss goal.  Get regular exercise as told by your doctor. This information is not intended to replace advice given to you by your health care provider. Make sure you discuss any questions you have with your health care provider. Document Revised: 04/24/2018 Document Reviewed: 04/24/2018 Elsevier Patient Education  [place].  Tobacco Use Disorder Tobacco use disorder (TUD) occurs when a person craves, seeks, and uses tobacco, regardless of the consequences. This disorder can cause problems with mental and physical health. It can affect your ability to have healthy relationships, and it can keep you from meeting your responsibilities at work, home, or school. Tobacco may be:  Smoked as a cigarette or cigar.  Inhaled using e-cigarettes.  Smoked in a pipe or hookah.  Chewed as smokeless tobacco.  Inhaled into the nostrils as snuff. Tobacco products contain a dangerous chemical called nicotine, which is very addictive. Nicotine triggers hormones that  make the body feel stimulated and works on areas of the brain that make you feel good. These effects can make it hard for people to quit nicotine. Tobacco contains many other unsafe chemicals that can damage almost every organ in the body. Smoking tobacco also puts others in danger due to fire risk and possible health problems caused by breathing in secondhand smoke. What are the signs or symptoms? Symptoms of TUD may include:  Being unable to slow down or stop your tobacco use.  Spending an abnormal amount of time getting or using tobacco.  Craving tobacco. Cravings may last for up to 6 months after quitting.  Tobacco use that: ? Interferes with your work, school, or home life. ? Interferes with your personal and social relationships. ? Makes you give up activities that you once enjoyed or found important.  Using tobacco even though you know that it is: ? Dangerous or bad for your  health or someone else's health. ? Causing problems in your life.  Needing more and more of the substance to get the same effect (developing tolerance).  Experiencing unpleasant symptoms if you do not use the substance (withdrawal). Withdrawal symptoms may include: ? Depressed, anxious, or irritable mood. ? Difficulty concentrating. ? Increased appetite. ? Restlessness or trouble sleeping.  Using the substance to avoid withdrawal. How is this diagnosed? This condition may be diagnosed based on:  Your current and past tobacco use. Your health care provider may ask questions about how your tobacco use affects your life.  A physical exam. You may be diagnosed with TUD if you have at least two symptoms within a 57-monthperiod. How is this treated? This condition is treated by stopping tobacco use. Many people are unable to quit on their own and need help. Treatment may include:  Nicotine replacement therapy (NRT). NRT provides nicotine without the other harmful chemicals in tobacco. NRT gradually lowers the dosage of nicotine in the body and reduces withdrawal symptoms. NRT is available as: ? Over-the-counter gums, lozenges, and skin patches. ? Prescription mouth inhalers and nasal sprays.  Medicine that acts on the brain to reduce cravings and withdrawal symptoms.  A type of talk therapy that examines your triggers for tobacco use, how to avoid them, and how to cope with cravings (behavioral therapy).  Hypnosis. This may help with withdrawal symptoms.  Joining a support group for others coping with TUD. The best treatment for TUD is usually a combination of medicine, talk therapy, and support groups. Recovery can be a long process. Many people start using tobacco again after stopping (relapse). If you relapse, it does not mean that treatment will not work. Follow these instructions at home:  Lifestyle  Do not use any products that contain nicotine or tobacco, such as cigarettes  and e-cigarettes.  Avoid things that trigger tobacco use as much as you can. Triggers include people and situations that usually cause you to use tobacco.  Avoid drinks that contain caffeine, including coffee. These may worsen some withdrawal symptoms.  Find ways to manage stress. Wanting to smoke may cause stress, and stress can make you want to smoke. Relaxation techniques such as deep breathing, meditation, and yoga may help.  Attend support groups as needed. These groups are an important part of long-term recovery for many people. General instructions  Take over-the-counter and prescription medicines only as told by your health care provider.  Check with your health care provider before taking any new prescription or over-the-counter medicines.  Decide on a  friend, family member, or smoking quit-line (such as 1-800-QUIT-NOW in the [place] that you can call or text when you feel the urge to smoke or when you need help coping with cravings.  Keep all follow-up visits as told by your health care provider and therapist. This is important. Contact a health care provider if:  You are not able to take your medicines as prescribed.  Your symptoms get worse, even with treatment. Summary  Tobacco use disorder (TUD) occurs when a person craves, seeks, and uses tobacco regardless of the consequences.  This condition may be diagnosed based on your current and past tobacco use and a physical exam.  Many people are unable to quit on their own and need help. Recovery can be a long process.  The most effective treatment for TUD is usually a combination of medicine, talk therapy, and support groups. This information is not intended to replace advice given to you by your health care provider. Make sure you discuss any questions you have with your health care provider. Document Revised: 08/07/2017 Document Reviewed: 08/07/2017 Elsevier Patient Education  2020 [employer].

## 2020-07-19 NOTE — Addendum Note (Signed)
Addended by: Shan Levans E on: 07/19/2020 11:31 AM   Modules accepted: Orders

## 2020-07-20 LAB — CBC WITH DIFFERENTIAL/PLATELET
Basophils Absolute: 0 10*3/uL (ref 0.0–0.2)
Basos: 1 %
EOS (ABSOLUTE): 0.2 10*3/uL (ref 0.0–0.4)
Eos: 3 %
Hematocrit: 44.2 % (ref 37.5–51.0)
Hemoglobin: 14.7 g/dL (ref 13.0–17.7)
Immature Grans (Abs): 0 10*3/uL (ref 0.0–0.1)
Immature Granulocytes: 0 %
Lymphocytes Absolute: 2.9 10*3/uL (ref 0.7–3.1)
Lymphs: 35 %
MCH: 28.8 pg (ref 26.6–33.0)
MCHC: 33.3 g/dL (ref 31.5–35.7)
MCV: 87 fL (ref 79–97)
Monocytes Absolute: 0.8 10*3/uL (ref 0.1–0.9)
Monocytes: 9 %
Neutrophils Absolute: 4.2 10*3/uL (ref 1.4–7.0)
Neutrophils: 52 %
Platelets: 197 10*3/uL (ref 150–450)
RBC: 5.11 x10E6/uL (ref 4.14–5.80)
RDW: 14.5 % (ref 11.6–15.4)
WBC: 8.2 10*3/uL (ref 3.4–10.8)

## 2020-07-20 LAB — HCV AB W/RFLX TO VERIFICATION: HCV Ab: <0.1 s/co ratio (ref 0.0–0.9)

## 2020-07-20 LAB — COMPREHENSIVE METABOLIC PANEL
ALT: 7 IU/L (ref 0–44)
AST: 21 IU/L (ref 0–40)
Albumin/Globulin Ratio: 2 (ref 1.2–2.2)
Albumin: 4.1 g/dL (ref 3.8–4.9)
Alkaline Phosphatase: 135 IU/L — ABNORMAL HIGH (ref 44–121)
BUN/Creatinine Ratio: 6 — ABNORMAL LOW (ref 9–20)
BUN: 7 mg/dL (ref 6–24)
Bilirubin Total: 0.3 mg/dL (ref 0.0–1.2)
CO2: 23 mmol/L (ref 20–29)
Calcium: 9 mg/dL (ref 8.7–10.2)
Chloride: 108 mmol/L — ABNORMAL HIGH (ref 96–106)
Creatinine, Ser: 1.19 mg/dL (ref 0.76–1.27)
GFR calc Af Amer: 79 mL/min/{1.73_m2} (ref >59)
GFR calc non Af Amer: 68 mL/min/{1.73_m2} (ref >59)
Globulin, Total: 2.1 g/dL (ref 1.5–4.5)
Glucose: 88 mg/dL (ref 65–99)
Potassium: 5 mmol/L (ref 3.5–5.2)
Sodium: 142 mmol/L (ref 134–144)
Total Protein: 6.2 g/dL (ref 6.0–8.5)

## 2020-07-20 LAB — TESTOSTERONE: Testosterone: 518 ng/dL (ref 264–916)

## 2020-07-20 LAB — HIV ANTIBODY (ROUTINE TESTING W REFLEX): HIV Screen 4th Generation wRfx: NONREACTIVE

## 2020-07-20 LAB — PSA: Prostate Specific Ag, Serum: 1.3 ng/mL (ref 0.0–4.0)

## 2020-07-20 LAB — HCV INTERPRETATION

## 2020-08-01 ENCOUNTER — Other Ambulatory Visit (HOSPITAL_BASED_OUTPATIENT_CLINIC_OR_DEPARTMENT_OTHER): Payer: Self-pay | Admitting: Internal Medicine

## 2020-08-01 ENCOUNTER — Ambulatory Visit: Payer: Self-pay | Attending: Internal Medicine

## 2020-08-01 DIAGNOSIS — Z23 Encounter for immunization: Secondary | ICD-10-CM

## 2020-08-01 MED FILL — JANSSEN COVID-19 VACCINE 0.: 0.5 | 1 days supply | Qty: 1 | Fill #0

## 2020-08-01 NOTE — Progress Notes (Signed)
   Covid-19 Vaccination Clinic  Name:  KEYGAN DUMOND    MRN: [id number] DOB: October 27, 1965  08/01/2020  Mr. Kleckner was observed post Covid-19 immunization for 15 minutes without incident. He was provided with Vaccine Information Sheet and instruction to access the V-Safe system.   Mr. Caggiano was instructed to call 911 with any severe reactions post vaccine: Marland Kitchen Difficulty breathing  . Swelling of face and throat  . A fast heartbeat  . A bad rash all over body  . Dizziness and weakness   Immunizations Administered    Name Date Dose VIS Date Route   JANSSEN COVID-19 VACCINE 08/01/2020 10:12 AM 0.5 mL 06/22/2020 Intramuscular   Manufacturer: [employer]   Lot: [id number]   NDC: 99774-142-39
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
## 2020-09-19 ENCOUNTER — Telehealth: Payer: Self-pay | Admitting: Critical Care Medicine

## 2020-09-19 ENCOUNTER — Ambulatory Visit: Payer: Self-pay | Attending: Critical Care Medicine | Admitting: Critical Care Medicine

## 2020-09-19 ENCOUNTER — Other Ambulatory Visit: Payer: Self-pay

## 2020-09-19 ENCOUNTER — Encounter: Payer: Self-pay | Admitting: Critical Care Medicine

## 2020-09-19 ENCOUNTER — Other Ambulatory Visit: Payer: Self-pay | Admitting: Critical Care Medicine

## 2020-09-19 DIAGNOSIS — N529 Male erectile dysfunction, unspecified: Secondary | ICD-10-CM

## 2020-09-19 DIAGNOSIS — F17200 Nicotine dependence, unspecified, uncomplicated: Secondary | ICD-10-CM

## 2020-09-19 MED ORDER — SILDENAFIL CITRATE 100 MG PO TABS: 50.0000 mg | ORAL_TABLET | Freq: Every day | ORAL | 11 refills | Status: DC | PRN

## 2020-09-19 MED FILL — SILDENAFIL CITRATE 100 MG T: 100 | 30 days supply | Qty: 5 | Fill #0

## 2020-09-19 NOTE — Telephone Encounter (Signed)
Copied from CRM [id number]. Topic: General - Other >> Sep 19, 2020  9:17 AM Elliot Gault wrote: Reason for CRM: patient missed 8:30am virtual appointment today and would like to [facility] with PCP virtual today preferable or in office best # [phone]. Patient would like PCP to refill sildenafil (VIAGRA) 100 MG tablet   [facility] [place], [place] - [place] [address] [employer] Phone:  [phone]  Fax:  [phone]

## 2020-09-19 NOTE — Assessment & Plan Note (Signed)
  .   Current smoking consumption amount: 1/2 pack a day  . Dicsussion on advise to quit smoking and smoking impacts: Cardiovascular and lung health  . Patient's willingness to quit: Interested in quitting  . Methods to quit smoking discussed: Nicotine replacement and behavioral modification  . Medication management of smoking session drugs discussed: Nicotine lozenges 4 mg to take 3 times daily as needed  . Resources provided:  AVS   . Setting quit date not yet established  . Follow-up arranged 2 months   Time spent counseling the patient: 5 minutes

## 2020-09-19 NOTE — Assessment & Plan Note (Signed)
Erectile dysfunction improved with Viagra refills given note testosterone level was normal as was PSA

## 2020-09-19 NOTE — Progress Notes (Signed)
Follow up regarding last appt Would like a RF on viagra -donates plasma last Friday- states BP was normal

## 2020-09-19 NOTE — Telephone Encounter (Signed)
Visit completed today

## 2020-10-21 ENCOUNTER — Ambulatory Visit: Payer: Self-pay | Attending: Critical Care Medicine

## 2020-10-21 ENCOUNTER — Other Ambulatory Visit: Payer: Self-pay

## 2020-10-26 ENCOUNTER — Telehealth: Payer: Self-pay | Admitting: Critical Care Medicine

## 2020-10-26 NOTE — Telephone Encounter (Signed)
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

## 2020-11-08 ENCOUNTER — Telehealth: Payer: Self-pay | Admitting: Critical Care Medicine

## 2020-11-08 NOTE — Telephone Encounter (Signed)
Pt was sent a letter from financial dept. Inform them, that the application they submitted was incomplete, since they were missing some documentation at the time of the appointment, Pt need to reschedule and resubmit all new papers and application for CAFA and [place], P.S. old documents has been sent back by mail to the Pt and Pt. need to make a new appt. 

## 2021-01-13 ENCOUNTER — Other Ambulatory Visit: Payer: Self-pay

## 2021-01-13 MED FILL — Sildenafil Citrate Tab 100 MG: ORAL | 30 days supply | Qty: 5 | Fill #0 | Status: AC

## 2021-01-25 ENCOUNTER — Other Ambulatory Visit (HOSPITAL_BASED_OUTPATIENT_CLINIC_OR_DEPARTMENT_OTHER): Payer: Self-pay

## 2021-06-23 ENCOUNTER — Other Ambulatory Visit: Payer: Self-pay

## 2021-06-23 MED FILL — Sildenafil Citrate Tab 100 MG: ORAL | 30 days supply | Qty: 5 | Fill #1 | Status: AC

## 2021-07-13 ENCOUNTER — Ambulatory Visit: Payer: Self-pay | Attending: Internal Medicine

## 2021-07-13 DIAGNOSIS — Z23 Encounter for immunization: Secondary | ICD-10-CM

## 2021-07-13 NOTE — Progress Notes (Signed)
   Covid-19 Vaccination Clinic  Name:  Austin Walsh    MRN: [id number] DOB: 1966-07-04  07/13/2021  [name] was observed post Covid-19 immunization for 15 minutes without incident. He was provided with Vaccine Information Sheet and instruction to access the V-Safe system.   [name] was instructed to call 911 with any severe reactions post vaccine: Difficulty breathing  Swelling of face and throat  A fast heartbeat  A bad rash all over body  Dizziness and weakness   Immunizations Administered     Name Date Dose VIS Date Route   Pfizer Covid-19 Vaccine Bivalent Booster 07/13/2021 10:29 AM 0.3 mL 05/03/2021 Intramuscular   Manufacturer: [employer]   Lot: [id number]   NDC: [phone]

## 2021-08-01 ENCOUNTER — Other Ambulatory Visit (HOSPITAL_BASED_OUTPATIENT_CLINIC_OR_DEPARTMENT_OTHER): Payer: Self-pay

## 2021-08-01 MED ORDER — PFIZER COVID-19 VAC BIVALENT 30 MCG/0.3ML IM SUSP
INTRAMUSCULAR | 0 refills | Status: AC
  Filled 2021-08-01: qty 0.3, 1d supply, fill #0

## 2021-12-14 ENCOUNTER — Ambulatory Visit (INDEPENDENT_AMBULATORY_CARE_PROVIDER_SITE_OTHER): Payer: 59

## 2021-12-14 ENCOUNTER — Ambulatory Visit (HOSPITAL_COMMUNITY)
Admission: EM | Admit: 2021-12-14 | Discharge: 2021-12-14 | Disposition: A | Discharge status: Home / Self Care | Payer: 59 | Attending: Internal Medicine | Admitting: Internal Medicine

## 2021-12-14 ENCOUNTER — Other Ambulatory Visit: Payer: Self-pay

## 2021-12-14 ENCOUNTER — Encounter (HOSPITAL_COMMUNITY): Payer: Self-pay | Admitting: Emergency Medicine

## 2021-12-14 DIAGNOSIS — R079 Chest pain, unspecified: Secondary | ICD-10-CM

## 2021-12-14 DIAGNOSIS — J189 Pneumonia, unspecified organism: Secondary | ICD-10-CM

## 2021-12-14 LAB — CBC
HCT: 40.5 % (ref 39.0–52.0)
Hemoglobin: 14.1 g/dL (ref 13.0–17.0)
MCH: 28.1 pg (ref 26.0–34.0)
MCHC: 34.8 g/dL (ref 30.0–36.0)
MCV: 80.8 fL (ref 80.0–100.0)
Platelets: ADEQUATE 10*3/uL (ref 150–400)
RBC: 5.01 MIL/uL (ref 4.22–5.81)
RDW: 15.2 % (ref 11.5–15.5)
WBC: 18.4 10*3/uL — ABNORMAL HIGH (ref 4.0–10.5)
nRBC: 0 % (ref 0.0–0.2)

## 2021-12-14 LAB — BASIC METABOLIC PANEL
Anion gap: 4 — ABNORMAL LOW (ref 5–15)
BUN: 10 mg/dL (ref 6–20)
CO2: 26 mmol/L (ref 22–32)
Calcium: 8.8 mg/dL — ABNORMAL LOW (ref 8.9–10.3)
Chloride: 109 mmol/L (ref 98–111)
Creatinine, Ser: 1.25 mg/dL — ABNORMAL HIGH (ref 0.61–1.24)
GFR, Estimated: >60 mL/min (ref >60)
Glucose, Bld: 91 mg/dL (ref 70–99)
Potassium: 4.1 mmol/L (ref 3.5–5.1)
Sodium: 139 mmol/L (ref 135–145)

## 2021-12-14 MED ORDER — KETOROLAC TROMETHAMINE 30 MG/ML IJ SOLN
INTRAMUSCULAR | Status: AC
  Filled 2021-12-14: qty 1

## 2021-12-14 MED ORDER — DOXYCYCLINE HYCLATE 100 MG PO TABS
100.0000 mg | ORAL_TABLET | Freq: Two times a day (BID) | ORAL | 0 refills | Status: AC
  Filled 2021-12-14: qty 20, 10d supply, fill #0

## 2021-12-14 MED ORDER — ALBUTEROL SULFATE HFA 108 (90 BASE) MCG/ACT IN AERS
1.0000 | INHALATION_SPRAY | Freq: Four times a day (QID) | RESPIRATORY_TRACT | 0 refills | Status: AC | PRN
  Filled 2021-12-14: qty 18, 25d supply, fill #0

## 2021-12-14 MED ORDER — HYDROCODONE-ACETAMINOPHEN 5-325 MG PO TABS
1.0000 | ORAL_TABLET | Freq: Four times a day (QID) | ORAL | 0 refills | Status: AC | PRN
  Filled 2021-12-14: qty 10, 3d supply, fill #0

## 2021-12-14 MED ORDER — AMOXICILLIN-POT CLAVULANATE 875-125 MG PO TABS
1.0000 | ORAL_TABLET | Freq: Two times a day (BID) | ORAL | 0 refills | Status: AC
  Filled 2021-12-14: qty 10, 5d supply, fill #0

## 2021-12-14 MED ORDER — KETOROLAC TROMETHAMINE 30 MG/ML IJ SOLN
30.0000 mg | Freq: Once | INTRAMUSCULAR | Status: AC
  Administered 2021-12-14: 30 mg via INTRAMUSCULAR

## 2021-12-14 NOTE — ED Triage Notes (Signed)
Pt is present today with right flank that radiates to his back. Pt states he noticed the pain last week. Pt denies any injury. Pt states that with movement the pain intensifies.  ?

## 2021-12-14 NOTE — Discharge Instructions (Addendum)
Please take medications as prescribed ?Smoke cessation will help with symptoms ?Maintain adequate hydration ?We will call you with recommendations if labs are abnormal ?Return to urgent care if you develop worsening chest pain, shortness of breath, confusion or wheezing. ?

## 2021-12-14 NOTE — ED Provider Notes (Signed)
?[facility] ? ? ? ?CSN: [id number] ?Arrival date & time: 12/14/21  1030 ? ? ?  ? ?History   ?Chief Complaint ?Chief Complaint  ?Patient presents with  ? Back Pain  ? Flank Pain  ? ? ?HPI ?Austin Walsh is a 56 y.o. male with a 40-pack-year history of smoking comes to urgent care with 7 days history of cough, right-sided chest pain.  Patient's symptoms started 7 days ago and has been persistent.  Is associated with some chest tightness and pleuritic chest pain.  Pain is currently 10 out of 10, sharp, aggravated by taking a deep breath and not relieved with over-the-counter ibuprofen.  Patient denies long distance travel, calf pain or swelling.  No fever or chills.  Patient has some shortness of breath but denies any wheezing.  He continues to smoke about a pack of cigarettes a day.  No recent long distance travel.  Patient's job requires moving heavy furniture.  No trauma or falls.  No calf swelling.  ? ?HPI ? ?History reviewed. No pertinent past medical history. ? ?Patient Active Problem List  ? Diagnosis Date Noted  ? Erectile dysfunction 07/19/2020  ? TOBACCO USE DISORDER/SMOKER-SMOKING CESSATION DISCUSSED 02/01/2009  ? ? ?Past Surgical History:  ?Procedure Laterality Date  ? ABDOMINAL SURGERY  1990  ? ? ? ? ? ?Home Medications   ? ?Prior to Admission medications   ?Medication Sig Start Date End Date Taking? Authorizing Provider  ?albuterol (VENTOLIN HFA) 108 (90 Base) MCG/ACT inhaler Inhale 1-2 puffs into the lungs every 6 (six) hours as needed for wheezing or shortness of breath. 12/14/21  Yes Karleen Seebeck, Britta Mccreedy, MD  ?amoxicillin-clavulanate (AUGMENTIN) 875-125 MG tablet Take 1 tablet by mouth every 12 (twelve) hours for 5 days. 12/14/21 12/19/21 Yes Mikyah Alamo, Britta Mccreedy, MD  ?doxycycline (VIBRA-TABS) 100 MG tablet Take 1 tablet (100 mg total) by mouth 2 (two) times daily. 12/14/21  Yes Mckaylin Bastien, Britta Mccreedy, MD  ?HYDROcodone-acetaminophen (NORCO/VICODIN) 5-325 MG tablet Take 1 tablet by mouth every 6 (six)  hours as needed. 12/14/21  Yes Eura Mccauslin, Britta Mccreedy, MD  ?COVID-19 mRNA bivalent vaccine, Pfizer, (PFIZER COVID-19 VAC BIVALENT) injection Inject into the muscle. 07/13/21   Judyann Munson, MD  ?sildenafil (VIAGRA) 100 MG tablet TAKE 0.5-1 TABLETS (50-100 MG TOTAL) BY MOUTH DAILY AS NEEDED FOR ERECTILE DYSFUNCTION. 09/19/20 09/19/21  Storm Frisk, MD  ? ? ?Family History ?Family History  ?Problem Relation Age of Onset  ? Seizures Mother   ? ? ?Social History ?Social History  ? ?Tobacco Use  ? Smoking status: Every Day  ?  Packs/day: 1.50  ?  Types: Cigarettes  ? Smokeless tobacco: Never  ?Substance Use Topics  ? Alcohol use: No  ? Drug use: No  ? ? ? ?Allergies   ?Patient has no known allergies. ? ? ?Review of Systems ?Review of Systems ?As per HPI ? ?Physical Exam ?Triage Vital Signs ?ED Triage Vitals  ?Enc Vitals Group  ?   BP 12/14/21 1227 112/72  ?   Pulse Rate 12/14/21 1227 86  ?   Resp 12/14/21 1227 19  ?   Temp 12/14/21 1227 98.2 ?F (36.8 ?C)  ?   Temp src --   ?   SpO2 12/14/21 1227 94 %  ?   Weight --   ?   Height --   ?   Head Circumference --   ?   Peak Flow --   ?   Pain Score 12/14/21 1225 10  ?  Pain Loc --   ?   Pain Edu? --   ?   Excl. in GC? --   ? ?No data found. ? ?Updated Vital Signs ?BP 112/72   Pulse 86   Temp 98.2 ?F (36.8 ?C)   Resp 19   SpO2 94%  ? ?Visual Acuity ?Right Eye Distance:   ?Left Eye Distance:   ?Bilateral Distance:   ? ?Right Eye Near:   ?Left Eye Near:    ?Bilateral Near:    ? ?Physical Exam ?Vitals and nursing note reviewed.  ?Constitutional:   ?   General: He is in acute distress.  ?   Appearance: He is ill-appearing.  ?HENT:  ?   Mouth/Throat:  ?   Mouth: Mucous membranes are moist.  ?Cardiovascular:  ?   Rate and Rhythm: Normal rate and regular rhythm.  ?   Pulses: Normal pulses.  ?   Heart sounds: Normal heart sounds.  ?Pulmonary:  ?   Effort: Respiratory distress present.  ?   Comments: Decreased air entry in the right lung base.  Rhonchi noted in the right lung  base.  No expiratory wheezing noted.  No rales or transmitted sounds. ?Neurological:  ?   Mental Status: He is alert.  ? ? ? ?UC Treatments / Results  ?Labs ?(all labs ordered are listed, but only abnormal results are displayed) ?Labs Reviewed  ?CBC - Abnormal; Notable for the following components:  ?    Result Value  ? WBC 18.4 (*)   ? All other components within normal limits  ?BASIC METABOLIC PANEL - Abnormal; Notable for the following components:  ? Creatinine, Ser 1.25 (*)   ? Calcium 8.8 (*)   ? Anion gap 4 (*)   ? All other components within normal limits  ? ? ?EKG ? ? ?Radiology ?DG Chest 2 View ? ?Result Date: 12/14/2021 ?CLINICAL DATA:  Chest pain EXAM: CHEST - 2 VIEW COMPARISON:  01/30/2017 FINDINGS: The heart size and mediastinal contours are within normal limits. Unchanged bibasilar scarring. The visualized skeletal structures are unremarkable. IMPRESSION: Unchanged bibasilar scarring. No acute abnormality of the lungs. Electronically Signed   By: Jearld LeschAlex D Bibbey M.D.   On: 12/14/2021 13:48   ? ?Procedures ?Procedures (including critical care time) ? ?Medications Ordered in UC ?Medications  ?ketorolac (TORADOL) 30 MG/ML injection 30 mg (30 mg Intramuscular Given 12/14/21 1405)  ? ? ?Initial Impression / Assessment and Plan / UC Course  ?I have reviewed the triage vital signs and the nursing notes. ? ?Pertinent labs & imaging results that were available during my care of the patient were reviewed by me and considered in my medical decision making (see chart for details). ? ?  ? ?1.  Community-acquired pneumonia: ?Chest x-ray independently reviewed by me shows right lung infiltrate ?Augmentin plus doxycycline ?Albuterol inhaler ?Smoke cessation advice given.  Patient is in the contemplative state of smoke cessation.  Time spent on smoke cessation is less than 10 minutes. ?CBC, BMP ?We will call patient with recommendations if labs are abnormal. ?Final Clinical Impressions(s) / UC Diagnoses  ? ?Final  diagnoses:  ?Community acquired pneumonia of right lower lobe of lung  ? ? ? ?Discharge Instructions   ? ?  ?Please take medications as prescribed ?Smoke cessation will help with symptoms ?Maintain adequate hydration ?We will call you with recommendations if labs are abnormal ?Return to urgent care if you develop worsening chest pain, shortness of breath, confusion or wheezing. ? ? ? ? ?ED Prescriptions   ? ?  Medication Sig Dispense Auth. Provider  ? amoxicillin-clavulanate (AUGMENTIN) 875-125 MG tablet Take 1 tablet by mouth every 12 (twelve) hours for 5 days. 10 tablet Phillis Thackeray, Britta Mccreedy, MD  ? doxycycline (VIBRA-TABS) 100 MG tablet Take 1 tablet (100 mg total) by mouth 2 (two) times daily. 20 tablet Shaye Elling, Britta Mccreedy, MD  ? albuterol (VENTOLIN HFA) 108 (90 Base) MCG/ACT inhaler Inhale 1-2 puffs into the lungs every 6 (six) hours as needed for wheezing or shortness of breath. 18 g Ilhan Madan, Britta Mccreedy, MD  ? HYDROcodone-acetaminophen (NORCO/VICODIN) 5-325 MG tablet Take 1 tablet by mouth every 6 (six) hours as needed. 10 tablet Alexyia Guarino, Britta Mccreedy, MD  ? ?  ? ?I have reviewed the PDMP during this encounter. ?  ?Merrilee Jansky, MD ?12/14/21 1613 ? ?

## 2021-12-15 ENCOUNTER — Other Ambulatory Visit (HOSPITAL_COMMUNITY): Payer: Self-pay

## 2022-03-29 ENCOUNTER — Other Ambulatory Visit: Payer: Self-pay

## 2022-03-29 ENCOUNTER — Other Ambulatory Visit: Payer: Self-pay | Admitting: Critical Care Medicine

## 2022-03-30 ENCOUNTER — Other Ambulatory Visit: Payer: Self-pay

## 2022-04-12 ENCOUNTER — Other Ambulatory Visit: Payer: Self-pay

## 2022-04-18 ENCOUNTER — Encounter (HOSPITAL_COMMUNITY): Payer: Self-pay | Admitting: Emergency Medicine

## 2022-04-18 ENCOUNTER — Telehealth (HOSPITAL_COMMUNITY): Payer: Self-pay | Admitting: Emergency Medicine

## 2022-04-18 ENCOUNTER — Ambulatory Visit (HOSPITAL_COMMUNITY)
Admission: EM | Admit: 2022-04-18 | Discharge: 2022-04-18 | Disposition: A | Discharge status: Home / Self Care | Payer: Commercial Managed Care - HMO | Attending: Internal Medicine | Admitting: Internal Medicine

## 2022-04-18 ENCOUNTER — Other Ambulatory Visit: Payer: Self-pay

## 2022-04-18 DIAGNOSIS — M722 Plantar fascial fibromatosis: Secondary | ICD-10-CM | POA: Diagnosis not present

## 2022-04-18 MED ORDER — TERBINAFINE HCL 250 MG PO TABS: 250.0000 mg | ORAL_TABLET | Freq: Every day | ORAL | 0 refills | Status: DC

## 2022-04-18 MED ORDER — TERBINAFINE HCL 250 MG PO TABS
250.0000 mg | ORAL_TABLET | Freq: Every day | ORAL | 0 refills | Status: AC
  Filled 2022-04-18: qty 28, 28d supply, fill #0

## 2022-04-18 MED ORDER — TERBINAFINE HCL 250 MG PO TABS
250.0000 mg | ORAL_TABLET | Freq: Every day | ORAL | 0 refills | Status: DC
  Filled 2022-04-18: qty 28, 28d supply, fill #0

## 2022-04-18 MED ORDER — IBUPROFEN 600 MG PO TABS
600.0000 mg | ORAL_TABLET | Freq: Four times a day (QID) | ORAL | 0 refills | Status: AC | PRN
  Filled 2022-04-18: qty 30, 8d supply, fill #0

## 2022-04-18 NOTE — ED Provider Notes (Signed)
[facility]    CSN: [id number] Arrival date & time: 04/18/22  1012      History   Chief Complaint Chief Complaint  Patient presents with   Foot Pain   Ankle Pain    HPI Austin Walsh is a 56 y.o. male comes to urgent care with 62-month history of right foot pain.  Pain is throbbing in nature, mainly on the right heel and aggravated by bearing weight.  No trauma to the right foot.  No falls.  No known relieving factors.  Pain radiates on the plantar aspect of the right foot towards the toes.  No numbness or tingling noted..  Patient complains of discoloration of both great toes.  Both toenails have friable and breaks easily.  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 HPI  History reviewed. No pertinent past medical history.  Patient Active Problem List   Diagnosis Date Noted   Erectile dysfunction 07/19/2020   TOBACCO USE DISORDER/SMOKER-SMOKING CESSATION DISCUSSED 02/01/2009    Past Surgical History:  Procedure Laterality Date   ABDOMINAL SURGERY  1990       Home Medications    Prior to Admission medications   Medication Sig Start Date End Date Taking? Authorizing Provider  ibuprofen (ADVIL) 600 MG tablet Take 1 tablet (600 mg total) by mouth every 6 (six) hours as needed. 04/18/22  Yes Maela Takeda, Britta Mccreedy, MD  terbinafine (LAMISIL) 250 MG tablet Take 1 tablet (250 mg total) by mouth daily. 04/18/22  Yes Lynford Espinoza, Britta Mccreedy, MD  albuterol (VENTOLIN HFA) 108 (90 Base) MCG/ACT inhaler Inhale 1-2 puffs into the lungs every 6 (six) hours as needed for wheezing or shortness of breath. 12/14/21   Riggs Dineen, Britta Mccreedy, MD  COVID-19 mRNA bivalent vaccine, Pfizer, (PFIZER COVID-19 VAC BIVALENT) injection Inject into the muscle. 07/13/21   Judyann Munson, MD  doxycycline (VIBRA-TABS) 100 MG tablet Take 1 tablet (100 mg total) by mouth 2 (two) times daily. 12/14/21   Pranathi Winfree, Britta Mccreedy, MD  HYDROcodone-acetaminophen (NORCO/VICODIN) 5-325 MG tablet Take 1 tablet  by mouth every 6 (six) hours as needed. 12/14/21   Madaleine Simmon, Britta Mccreedy, MD  sildenafil (VIAGRA) 100 MG tablet TAKE 0.5-1 TABLETS (50-100 MG TOTAL) BY MOUTH DAILY AS NEEDED FOR ERECTILE DYSFUNCTION. 09/19/20 09/19/21  Storm Frisk, MD    Family History Family History  Problem Relation Age of Onset   Seizures Mother     Social History Social History   Tobacco Use   Smoking status: Every Day    Packs/day: 1.50    Types: Cigarettes   Smokeless tobacco: Never  Substance Use Topics   Alcohol use: No   Drug use: No     Allergies   Patient has no known allergies.   Review of Systems Review of Systems  Gastrointestinal: Negative.   Genitourinary: Negative.   Musculoskeletal:  Negative for arthralgias, gait problem and joint swelling.  Skin: Negative.      Physical Exam Triage Vital Signs ED Triage Vitals  Enc Vitals Group     BP 04/18/22 1126 (!) 113/56     Pulse Rate 04/18/22 1126 74     Resp 04/18/22 1126 18     Temp 04/18/22 1126 97.7 F (36.5 C)     Temp Source 04/18/22 1126 Oral     SpO2 04/18/22 1126 97 %     Weight --      Height --      Head Circumference --      Peak Flow --  Pain Score 04/18/22 1125 5     Pain Loc --      Pain Edu? --      Excl. in GC? --    No data found.  Updated Vital Signs BP (!) 113/56 (BP Location: Right Arm)   Pulse 74   Temp 97.7 F (36.5 C) (Oral)   Resp 18   SpO2 97%   Visual Acuity Right Eye Distance:   Left Eye Distance:   Bilateral Distance:    Right Eye Near:   Left Eye Near:    Bilateral Near:     Physical Exam Vitals and nursing note reviewed.  Constitutional:      General: He is not in acute distress.    Appearance: He is not ill-appearing.  Cardiovascular:     Rate and Rhythm: Normal rate and regular rhythm.  Musculoskeletal:        General: Tenderness present. Normal range of motion.     Comments: Tenderness on palpation over the right heel.  No bruising or swelling.  Full range of motion of  the right ankle.  Discoloration of great toenails bilaterally  Neurological:     Mental Status: He is alert.      UC Treatments / Results  Labs (all labs ordered are listed, but only abnormal results are displayed) Labs Reviewed - No data to display  EKG   Radiology No results found.  Procedures Procedures (including critical care time)  Medications Ordered in UC Medications - No data to display  Initial Impression / Assessment and Plan / UC Course  I have reviewed the triage vital signs and the nursing notes.  Pertinent labs & imaging results that were available during my care of the patient were reviewed by me and considered in my medical decision making (see chart for details).     1.  Plantar fasciitis of the right foot: Ibuprofen as needed for pain Rest, elevation, icing of the right foot Plantar fasciitis rehab education given No indication for imaging at this time Return precautions given  2.  Onychomycosis of both great toes Lamisil 250 mg orally daily for 28 days Return precautions given Final Clinical Impressions(s) / UC Diagnoses   Final diagnoses:  Plantar fasciitis of right foot     Discharge Instructions      Please take medications as prescribed. Rest and elevate the affected painful area.   Apply cold compresses intermittently as needed.   As pain recedes, begin normal activities slowly as tolerated.   Return to urgent care if you have persistent or worsening symptoms.   ED Prescriptions     Medication Sig Dispense Auth. Provider   terbinafine (LAMISIL) 250 MG tablet  (Status: Discontinued) Take 1 tablet (250 mg total) by mouth daily. 28 tablet Leron Stoffers, Britta Mccreedy, MD   ibuprofen (ADVIL) 600 MG tablet Take 1 tablet (600 mg total) by mouth every 6 (six) hours as needed. 30 tablet Ilyse Tremain, Britta Mccreedy, MD   terbinafine (LAMISIL) 250 MG tablet Take 1 tablet (250 mg total) by mouth daily. 28 tablet Laura Caldas, Britta Mccreedy, MD      PDMP not  reviewed this encounter.   Merrilee Jansky, MD 04/18/22 [phone]

## 2022-04-18 NOTE — Discharge Instructions (Addendum)
Please take medications as prescribed. Rest and elevate the affected painful area.   Apply cold compresses intermittently as needed.   As pain recedes, begin normal activities slowly as tolerated.   Return to urgent care if you have persistent or worsening symptoms.

## 2022-04-18 NOTE — ED Triage Notes (Signed)
Pt reports right foot and ankle pain for couple months. Reports might had twisted it at work but not for certain. Denies falls.

## 2022-04-19 ENCOUNTER — Other Ambulatory Visit: Payer: Self-pay

## 2022-07-03 ENCOUNTER — Other Ambulatory Visit (HOSPITAL_BASED_OUTPATIENT_CLINIC_OR_DEPARTMENT_OTHER): Payer: Self-pay

## 2022-12-10 IMAGING — DX DG CHEST 2V
2 series · 2 of 2 positions shown · non-contrast
Comparison: 01/30/2017

CLINICAL DATA: Chest pain

EXAM:
CHEST - 2 VIEW

[chest pa]
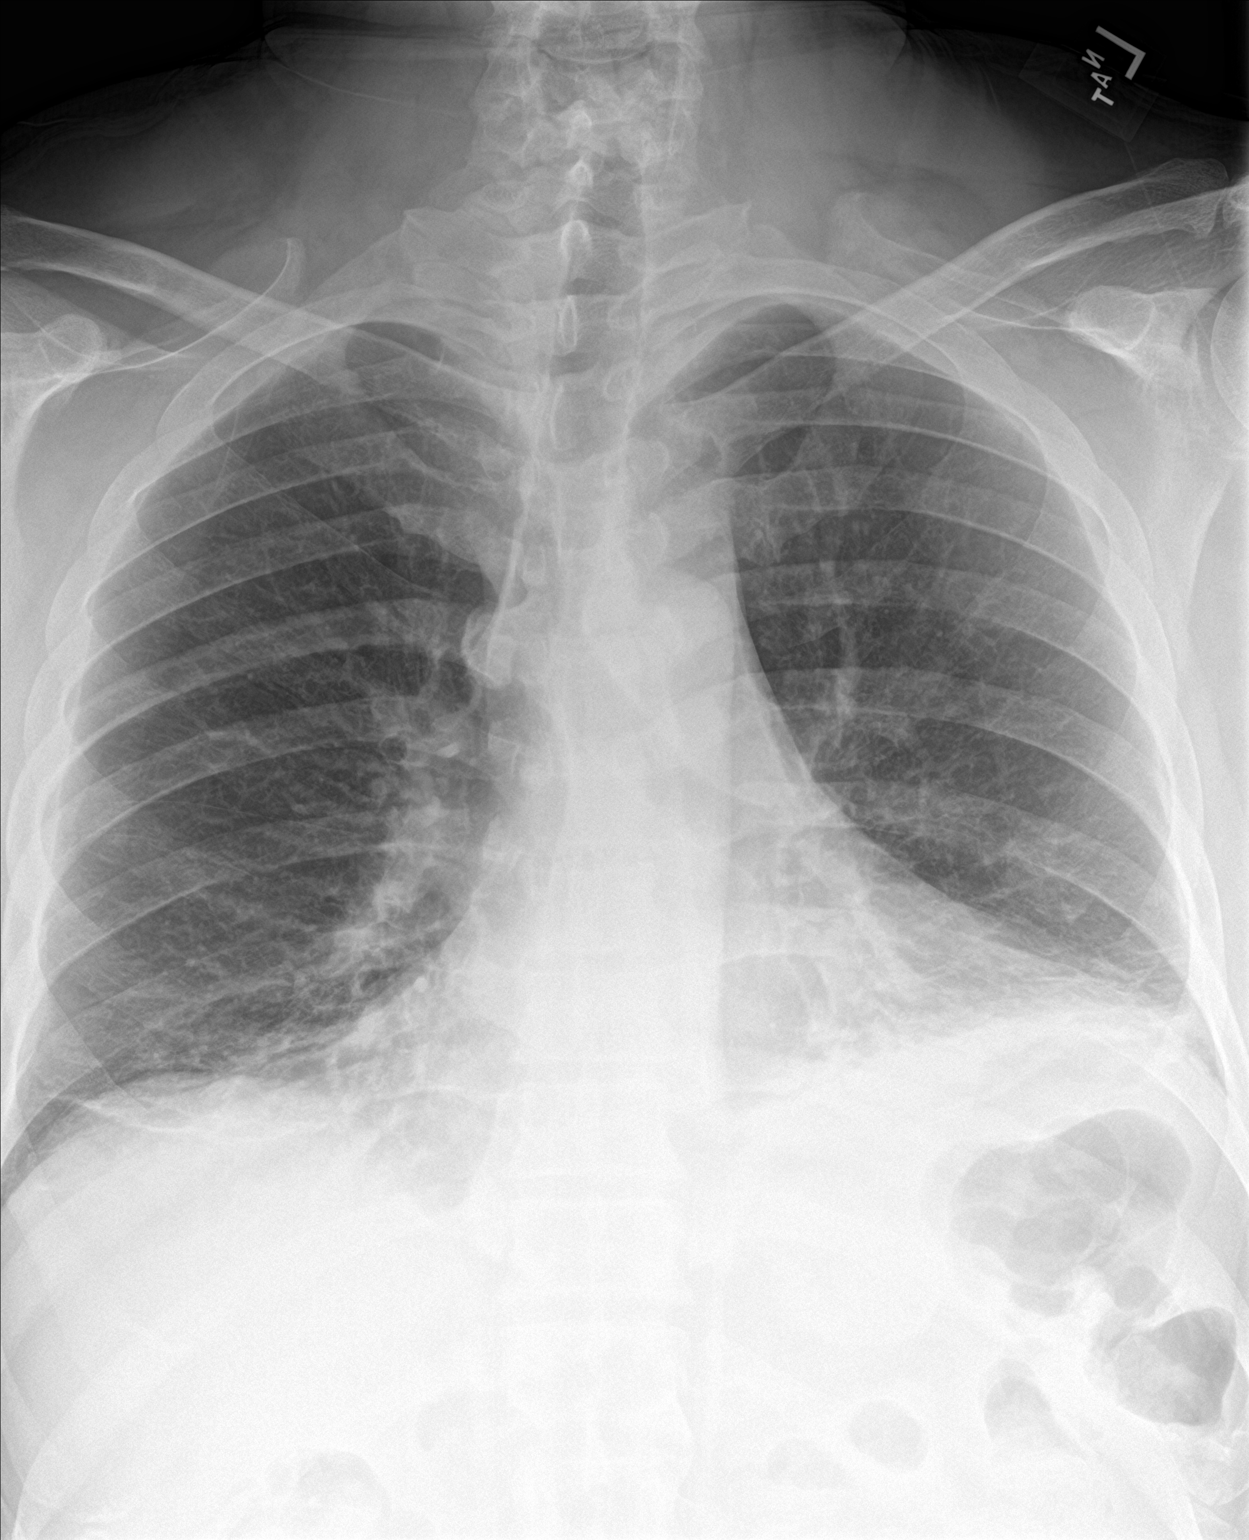

[chest lat]
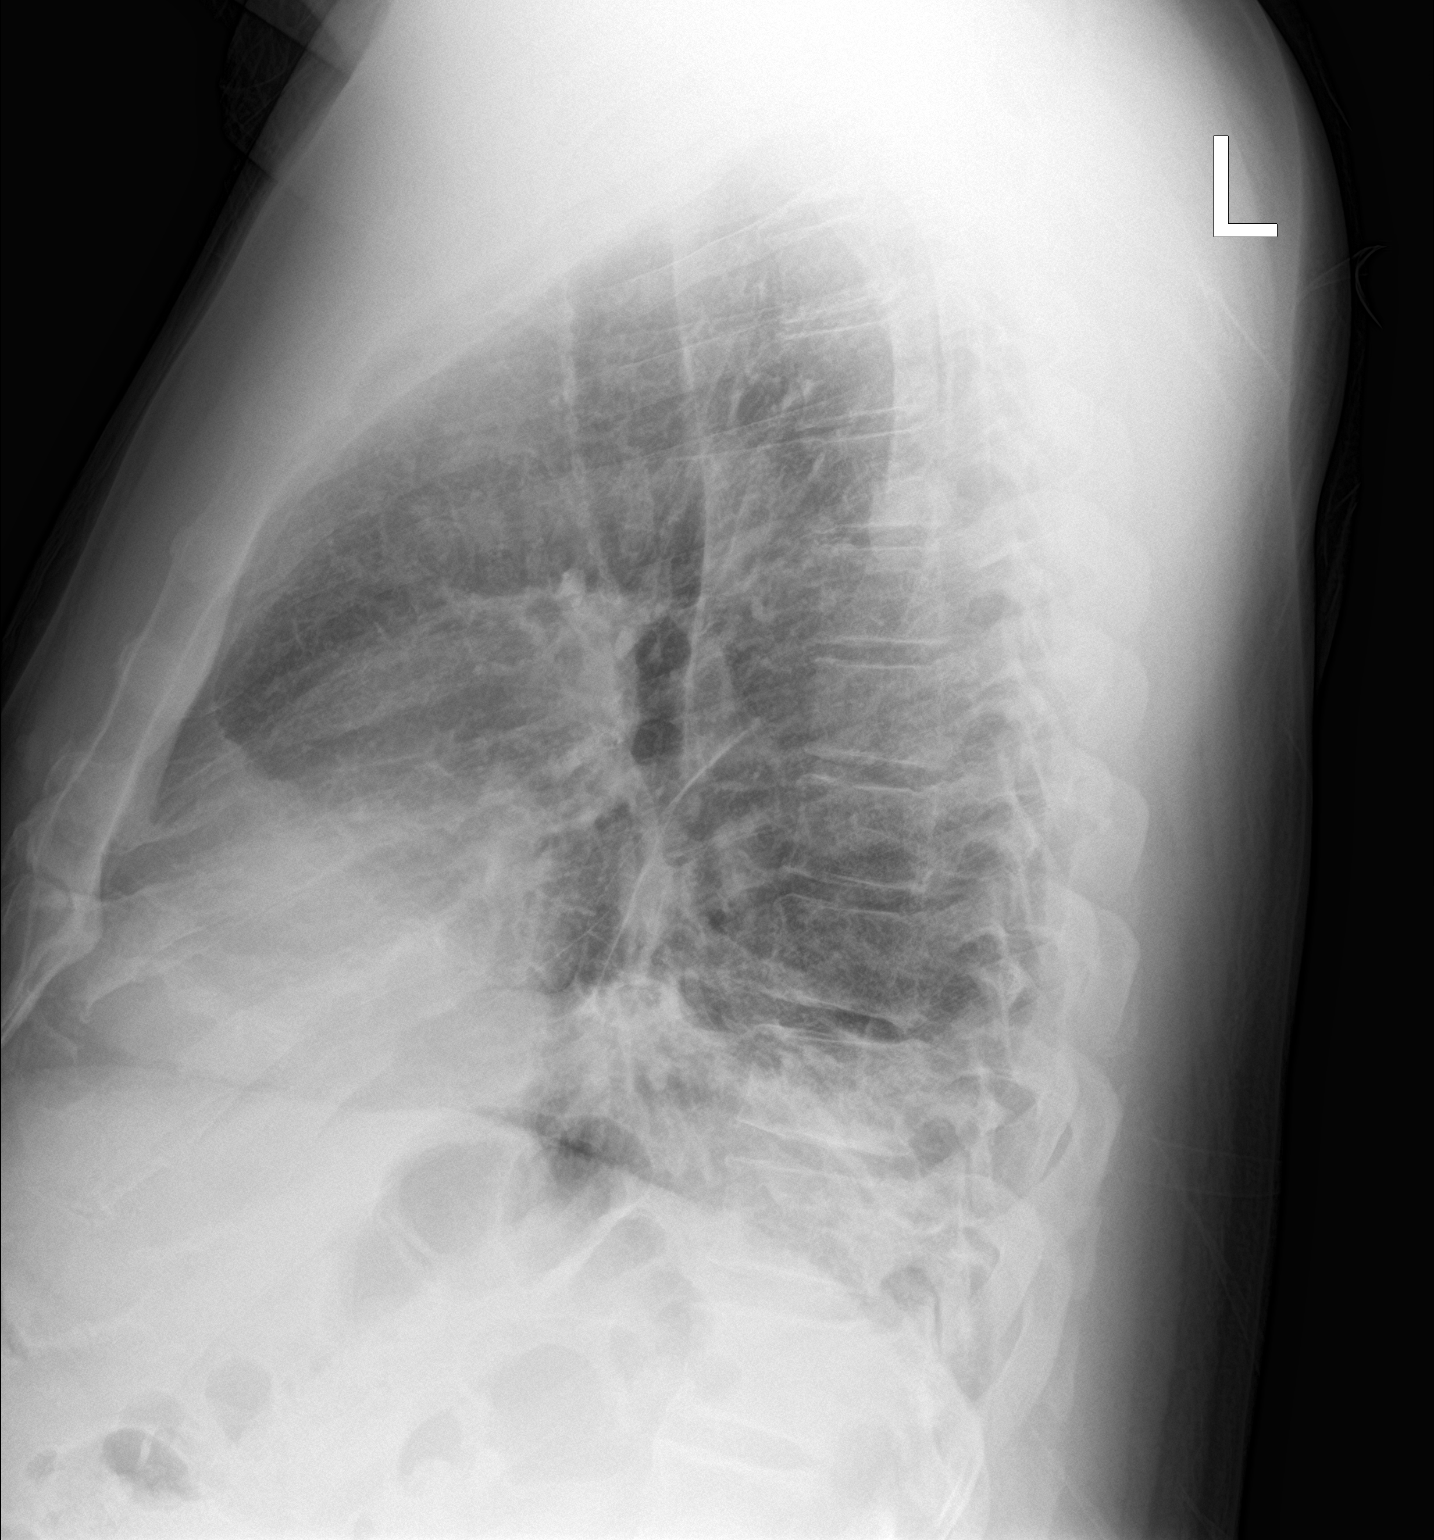

[2 of 2 positions shown; findings below may reference images not displayed]

FINDINGS: The heart size and mediastinal contours are within normal limits.
Unchanged bibasilar scarring. The visualized skeletal structures are
unremarkable.
IMPRESSION: Unchanged bibasilar scarring. No acute abnormality of the lungs.

## 2023-03-14 ENCOUNTER — Encounter (HOSPITAL_COMMUNITY): Payer: Self-pay

## 2023-03-14 ENCOUNTER — Ambulatory Visit (HOSPITAL_COMMUNITY)
Admission: EM | Admit: 2023-03-14 | Discharge: 2023-03-14 | Disposition: A | Discharge status: Home / Self Care | Payer: MEDICAID | Attending: Internal Medicine | Admitting: Internal Medicine

## 2023-03-14 ENCOUNTER — Other Ambulatory Visit: Payer: Self-pay

## 2023-03-14 DIAGNOSIS — R1030 Lower abdominal pain, unspecified: Secondary | ICD-10-CM | POA: Diagnosis not present

## 2023-03-14 DIAGNOSIS — S29019A Strain of muscle and tendon of unspecified wall of thorax, initial encounter: Secondary | ICD-10-CM | POA: Diagnosis not present

## 2023-03-14 MED ORDER — ACETAMINOPHEN 325 MG PO TABS
975.0000 mg | ORAL_TABLET | Freq: Once | ORAL | Status: AC
  Administered 2023-03-14: 975 mg via ORAL

## 2023-03-14 MED ORDER — KETOROLAC TROMETHAMINE 30 MG/ML IJ SOLN
30.0000 mg | Freq: Once | INTRAMUSCULAR | Status: AC
Start: 2023-03-14 — End: 2023-03-14
  Administered 2023-03-14: 30 mg via INTRAMUSCULAR

## 2023-03-14 MED ORDER — METHOCARBAMOL 500 MG PO TABS
500.0000 mg | ORAL_TABLET | Freq: Two times a day (BID) | ORAL | 0 refills | Status: AC
  Filled 2023-03-14: qty 20, 10d supply, fill #0

## 2023-03-14 MED ORDER — ACETAMINOPHEN 325 MG PO TABS
ORAL_TABLET | ORAL | Status: AC
  Filled 2023-03-14: qty 3

## 2023-03-14 MED ORDER — KETOROLAC TROMETHAMINE 30 MG/ML IJ SOLN
INTRAMUSCULAR | Status: AC
  Filled 2023-03-14: qty 1

## 2023-03-14 NOTE — ED Provider Notes (Signed)
[facility]    CSN: [id number] Arrival date & time: 03/14/23  0272      History   Chief Complaint Chief Complaint  Patient presents with   Abdominal Pain   Back Pain    HPI Austin Walsh is a 57 y.o. male.   Patient presents to urgent care for evaluation of low back pain that wraps around the bilateral sides and into the bilateral lower abdomen that started 1 week ago. 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 Pain started last week one morning when he was stretching and moving around. No recent trauma/injuries to the low back or the abdomen. Pain is significantly worse with movement, relieved by rest and laying down. No nausea, vomiting, urinary symptoms, gross hematuria, constipation, diarrhea, saddle anesthesia symptoms, paresthesias to the extremities or extremity weakness, or dizziness. No recent fevers/chills. Taking tylenol arthritis strength and ibuprofen for pain without much relief.    Abdominal Pain Back Pain Associated symptoms: abdominal pain     History reviewed. No pertinent past medical history.  Patient Active Problem List   Diagnosis Date Noted   Erectile dysfunction 07/19/2020   TOBACCO USE DISORDER/SMOKER-SMOKING CESSATION DISCUSSED 02/01/2009    Past Surgical History:  Procedure Laterality Date   ABDOMINAL SURGERY  1990       Home Medications    Prior to Admission medications   Medication Sig Start Date End Date Taking? Authorizing Provider  methocarbamol (ROBAXIN) 500 MG tablet Take 1 tablet (500 mg total) by mouth 2 (two) times daily. 03/14/23  Yes Carlisle Beers, FNP  albuterol (VENTOLIN HFA) 108 (90 Base) MCG/ACT inhaler Inhale 1-2 puffs into the lungs every 6 (six) hours as needed for wheezing or shortness of breath. 12/14/21   Lamptey, Britta Mccreedy, MD  COVID-19 mRNA bivalent vaccine, Pfizer, (PFIZER COVID-19 VAC BIVALENT) injection  Inject into the muscle. 07/13/21   Judyann Munson, MD  doxycycline (VIBRA-TABS) 100 MG tablet Take 1 tablet (100 mg total) by mouth 2 (two) times daily. 12/14/21   Lamptey, Britta Mccreedy, MD  HYDROcodone-acetaminophen (NORCO/VICODIN) 5-325 MG tablet Take 1 tablet by mouth every 6 (six) hours as needed. 12/14/21   Lamptey, Britta Mccreedy, MD  ibuprofen (ADVIL) 600 MG tablet Take 1 tablet (600 mg total) by mouth every 6 (six) hours as needed. 04/18/22   Lamptey, Britta Mccreedy, MD  sildenafil (VIAGRA) 100 MG tablet TAKE 0.5-1 TABLETS (50-100 MG TOTAL) BY MOUTH DAILY AS NEEDED FOR ERECTILE DYSFUNCTION. 09/19/20 09/19/21  Storm Frisk, MD  terbinafine (LAMISIL) 250 MG tablet Take 1 tablet (250 mg total) by mouth daily. 04/18/22   Lamptey, Britta Mccreedy, MD    Family History Family History  Problem Relation Age of Onset   Seizures Mother     Social History Social History   Tobacco Use   Smoking status: Every Day    Current packs/day: 1.50    Types: Cigarettes   Smokeless tobacco: Never  Vaping Use   Vaping status: Never Used  Substance Use Topics   Alcohol use: No   Drug use: No     Allergies   Patient has no known allergies.   Review of Systems Review of Systems  Gastrointestinal:  Positive for abdominal pain.  Musculoskeletal:  Positive for back pain.  Per HPI   Physical Exam Triage Vital Signs ED Triage Vitals  Encounter Vitals Group     BP  03/14/23 0914 113/74     Systolic BP Percentile --      Diastolic BP Percentile --      Pulse Rate 03/14/23 0914 69     Resp 03/14/23 0914 18     Temp 03/14/23 0914 97.8 F (36.6 C)     Temp Source 03/14/23 0914 Oral     SpO2 03/14/23 0914 96 %     Weight 03/14/23 0914 230 lb (104.3 kg)     Height 03/14/23 0914 5' 11.5" (1.816 m)     Head Circumference --      Peak Flow --      Pain Score 03/14/23 0912 6     Pain Loc --      Pain Education --      Exclude from Growth Chart --    No data found.  Updated Vital Signs BP 113/74 (BP Location:  Right Arm)   Pulse 69   Temp 97.8 F (36.6 C) (Oral)   Resp 18   Ht 5' 11.5" (1.816 m)   Wt 230 lb (104.3 kg)   SpO2 96%   BMI 31.63 kg/m   Visual Acuity Right Eye Distance:   Left Eye Distance:   Bilateral Distance:    Right Eye Near:   Left Eye Near:    Bilateral Near:     Physical Exam Vitals and nursing note reviewed.  Constitutional:      Appearance: He is not ill-appearing or toxic-appearing.  HENT:     Head: Normocephalic and atraumatic.     Right Ear: Hearing and external ear normal.     Left Ear: Hearing and external ear normal.     Nose: Nose normal.     Mouth/Throat:     Lips: Pink.  Eyes:     General: Lids are normal. Vision grossly intact. Gaze aligned appropriately.     Extraocular Movements: Extraocular movements intact.     Conjunctiva/sclera: Conjunctivae normal.  Pulmonary:     Effort: Pulmonary effort is normal.  Abdominal:     General: Abdomen is flat. Bowel sounds are normal.     Palpations: Abdomen is soft.     Tenderness: There is no abdominal tenderness. There is no right CVA tenderness, left CVA tenderness or guarding.  Musculoskeletal:     Cervical back: Normal and neck supple.     Thoracic back: Tenderness present. No swelling, edema, deformity, signs of trauma, lacerations, spasms or bony tenderness. Normal range of motion. No scoliosis.     Lumbar back: Normal.     Comments: Bilateral thoracic paraspinal tenderness to palpation worsened with movement and position changes during exam. Strength and sensation intact to bilateral lower extremities.   Skin:    General: Skin is warm and dry.     Capillary Refill: Capillary refill takes less than 2 seconds.     Findings: No rash.  Neurological:     General: No focal deficit present.     Mental Status: He is alert and oriented to person, place, and time. Mental status is at baseline.     Cranial Nerves: No dysarthria or facial asymmetry.  Psychiatric:        Mood and Affect: Mood normal.         Speech: Speech normal.        Behavior: Behavior normal.        Thought Content: Thought content normal.        Judgment: Judgment normal.      UC Treatments /  Results  Labs (all labs ordered are listed, but only abnormal results are displayed) Labs Reviewed - No data to display  EKG   Radiology No results found.  Procedures Procedures (including critical care time)  Medications Ordered in UC Medications  ketorolac (TORADOL) 30 MG/ML injection 30 mg (30 mg Intramuscular Given 03/14/23 0957)  acetaminophen (TYLENOL) tablet 975 mg (975 mg Oral Given 03/14/23 0957)    Initial Impression / Assessment and Plan / UC Course  I have reviewed the triage vital signs and the nursing notes.  Pertinent labs & imaging results that were available during my care of the patient were reviewed by me and considered in my medical decision making (see chart for details).   1.  Thoracic myofascial strain, initial encounter; lower abdominal pain Evaluation suggests pain is muscular in nature. Will manage this with rest, gentle ROM exercises, heat therapy, ibuprofen as needed for pain, and as needed use of muscle relaxer. Drowsiness precautions discussed regarding muscle relaxer use. Ketorolac 30mg  IM and tylenol 975mg  tylenol PO given in clinic (no NSAIDs for 24 hours).  Imaging: no indication for imaging based on stable musculoskeletal exam findings May follow-up with orthopedics as needed. No heavy lifting for the next 2-3 days while strain heals. Work note given.  Counseled patient on potential for adverse effects with medications prescribed/recommended today, strict ER and return-to-clinic precautions discussed, patient verbalized understanding.    Final Clinical Impressions(s) / UC Diagnoses   Final diagnoses:  Thoracic myofascial strain, initial encounter  Lower abdominal pain     Discharge Instructions      Your pain is likely due to a muscle strain which will improve on its  own with time.   - You may take over the counter medicines to help with pain.  - If you were given a shot of pain medicine in the clinic today, you may start taking ibuprofen/other NSAIDs in 12-24 hours. - You may also take the prescribed muscle relaxer as directed as needed for muscle aches/spasm.  Do not take this medication and drive or drink alcohol as it can make you sleepy.  Mainly use this medicine at nighttime as needed. - Apply heat 20 minutes on then 20 minutes off and perform gentle range of motion exercises to the area of greatest pain to prevent muscle stiffness and provide further pain relief.   Red flag symptoms to watch out for are numbness/tingling to the legs, weakness, loss of bowel/bladder control, and/or worsening pain that does not respond well to medicines. Follow-up with your primary care provider or return to urgent care if your symptoms do not improve in the next 3 to 4 days with medications and interventions recommended today. If your symptoms are severe (red flag), please go to the emergency room.       ED Prescriptions     Medication Sig Dispense Auth. Provider   methocarbamol (ROBAXIN) 500 MG tablet Take 1 tablet (500 mg total) by mouth 2 (two) times daily. 20 tablet Carlisle Beers, FNP      PDMP not reviewed this encounter.   Carlisle Beers, [place] 03/14/23 1003

## 2023-03-14 NOTE — Discharge Instructions (Signed)
Your pain is likely due to a muscle strain which will improve on its own with time.   - You may take over the counter medicines to help with pain.  - If you were given a shot of pain medicine in the clinic today, you may start taking ibuprofen/other NSAIDs in 12-24 hours. - You may also take the prescribed muscle relaxer as directed as needed for muscle aches/spasm.  Do not take this medication and drive or drink alcohol as it can make you sleepy.  Mainly use this medicine at nighttime as needed. - Apply heat 20 minutes on then 20 minutes off and perform gentle range of motion exercises to the area of greatest pain to prevent muscle stiffness and provide further pain relief.   Red flag symptoms to watch out for are numbness/tingling to the legs, weakness, loss of bowel/bladder control, and/or worsening pain that does not respond well to medicines. Follow-up with your primary care provider or return to urgent care if your symptoms do not improve in the next 3 to 4 days with medications and interventions recommended today. If your symptoms are severe (red flag), please go to the emergency room.   

## 2023-03-14 NOTE — ED Triage Notes (Signed)
Low Back Pain; Abdominal Pain onset 5 days ago. No urinary symptoms. No NVD. 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   Patient has h/o pulled muscles in the back 3-4 yrs ago.

## 2023-05-22 ENCOUNTER — Other Ambulatory Visit (HOSPITAL_BASED_OUTPATIENT_CLINIC_OR_DEPARTMENT_OTHER): Payer: Self-pay
